# Patient Record
Sex: Female | Born: 1970 | Race: White | Hispanic: No | Smoking: Never smoker
Health system: Southern US, Community
[De-identification: ages and names within clinical notes are randomized; demographics above are authoritative.]

## PROBLEM LIST (undated history)

## (undated) DIAGNOSIS — E039 Hypothyroidism, unspecified: Secondary | ICD-10-CM

## (undated) DIAGNOSIS — R112 Nausea with vomiting, unspecified: Secondary | ICD-10-CM

## (undated) DIAGNOSIS — L309 Dermatitis, unspecified: Secondary | ICD-10-CM

## (undated) DIAGNOSIS — Z9889 Other specified postprocedural states: Secondary | ICD-10-CM

## (undated) DIAGNOSIS — G4730 Sleep apnea, unspecified: Secondary | ICD-10-CM

## (undated) DIAGNOSIS — E0500 Thyrotoxicosis with diffuse goiter without thyrotoxic crisis or storm: Secondary | ICD-10-CM

## (undated) DIAGNOSIS — K219 Gastro-esophageal reflux disease without esophagitis: Secondary | ICD-10-CM

## (undated) DIAGNOSIS — L409 Psoriasis, unspecified: Secondary | ICD-10-CM

## (undated) DIAGNOSIS — K2270 Barrett's esophagus without dysplasia: Secondary | ICD-10-CM

## (undated) HISTORY — PX: OTHER SURGICAL HISTORY: SHX169

## (undated) HISTORY — PX: ESOPHAGOGASTRODUODENOSCOPY: SHX1529

## (undated) HISTORY — PX: COLONOSCOPY: SHX174

## (undated) HISTORY — PX: APPENDECTOMY: SHX54

## (undated) SURGERY — Surgical Case
Anesthesia: *Unknown

---

## 1998-01-24 ENCOUNTER — Ambulatory Visit (HOSPITAL_COMMUNITY): Admission: RE | Admit: 1998-01-24 | Discharge: 1998-01-24 | Payer: Self-pay | Admitting: Obstetrics and Gynecology

## 2001-05-08 ENCOUNTER — Other Ambulatory Visit: Admission: RE | Admit: 2001-05-08 | Discharge: 2001-05-08 | Payer: Self-pay | Admitting: Obstetrics and Gynecology

## 2002-08-05 ENCOUNTER — Other Ambulatory Visit: Admission: RE | Admit: 2002-08-05 | Discharge: 2002-08-05 | Payer: Self-pay | Admitting: Obstetrics and Gynecology

## 2002-11-26 ENCOUNTER — Ambulatory Visit (HOSPITAL_COMMUNITY): Admission: RE | Admit: 2002-11-26 | Discharge: 2002-11-26 | Payer: Self-pay | Admitting: Obstetrics and Gynecology

## 2002-11-26 ENCOUNTER — Encounter: Payer: Self-pay | Admitting: Obstetrics and Gynecology

## 2003-08-19 ENCOUNTER — Other Ambulatory Visit: Admission: RE | Admit: 2003-08-19 | Discharge: 2003-08-19 | Payer: Self-pay | Admitting: Obstetrics and Gynecology

## 2004-08-24 ENCOUNTER — Other Ambulatory Visit: Admission: RE | Admit: 2004-08-24 | Discharge: 2004-08-24 | Payer: Self-pay | Admitting: Obstetrics and Gynecology

## 2006-02-13 ENCOUNTER — Other Ambulatory Visit: Payer: Self-pay

## 2006-02-13 ENCOUNTER — Emergency Department: Payer: Self-pay | Admitting: Emergency Medicine

## 2008-09-25 ENCOUNTER — Emergency Department: Payer: Self-pay | Admitting: Emergency Medicine

## 2009-09-27 ENCOUNTER — Ambulatory Visit: Payer: Self-pay | Admitting: Family Medicine

## 2010-03-13 ENCOUNTER — Ambulatory Visit: Payer: Self-pay | Admitting: Physician Assistant

## 2011-03-14 ENCOUNTER — Ambulatory Visit: Payer: Self-pay | Admitting: Family Medicine

## 2011-03-21 ENCOUNTER — Ambulatory Visit: Payer: Self-pay | Admitting: Family Medicine

## 2011-12-20 ENCOUNTER — Encounter (HOSPITAL_COMMUNITY): Payer: Self-pay | Admitting: *Deleted

## 2011-12-24 ENCOUNTER — Encounter (HOSPITAL_COMMUNITY): Payer: Self-pay | Admitting: Pharmacist

## 2012-01-04 ENCOUNTER — Ambulatory Visit (HOSPITAL_COMMUNITY)
Admission: RE | Admit: 2012-01-04 | Discharge: 2012-01-04 | Disposition: A | Discharge status: Home / Self Care | Payer: 59 | Source: Ambulatory Visit | Attending: Obstetrics and Gynecology | Admitting: Obstetrics and Gynecology

## 2012-01-04 ENCOUNTER — Ambulatory Visit (HOSPITAL_COMMUNITY): Payer: 59 | Admitting: Anesthesiology

## 2012-01-04 ENCOUNTER — Encounter (HOSPITAL_COMMUNITY): Payer: Self-pay | Admitting: Anesthesiology

## 2012-01-04 ENCOUNTER — Encounter (HOSPITAL_COMMUNITY)
Admission: RE | Disposition: A | Discharge status: Home / Self Care | Payer: Self-pay | Source: Ambulatory Visit | Attending: Obstetrics and Gynecology

## 2012-01-04 ENCOUNTER — Encounter (HOSPITAL_COMMUNITY): Payer: Self-pay | Admitting: *Deleted

## 2012-01-04 DIAGNOSIS — N882 Stricture and stenosis of cervix uteri: Secondary | ICD-10-CM | POA: Insufficient documentation

## 2012-01-04 DIAGNOSIS — N939 Abnormal uterine and vaginal bleeding, unspecified: Secondary | ICD-10-CM | POA: Diagnosis present

## 2012-01-04 DIAGNOSIS — N949 Unspecified condition associated with female genital organs and menstrual cycle: Secondary | ICD-10-CM | POA: Insufficient documentation

## 2012-01-04 DIAGNOSIS — N938 Other specified abnormal uterine and vaginal bleeding: Secondary | ICD-10-CM | POA: Insufficient documentation

## 2012-01-04 HISTORY — DX: Thyrotoxicosis with diffuse goiter without thyrotoxic crisis or storm: E05.00

## 2012-01-04 HISTORY — DX: Other specified postprocedural states: Z98.890

## 2012-01-04 HISTORY — DX: Nausea with vomiting, unspecified: R11.2

## 2012-01-04 LAB — BASIC METABOLIC PANEL
CO2: 28 mEq/L (ref 19–32)
Calcium: 9.2 mg/dL (ref 8.4–10.5)
Chloride: 104 mEq/L (ref 96–112)
GFR calc Af Amer: >90 mL/min (ref >90)
Sodium: 138 mEq/L (ref 135–145)

## 2012-01-04 LAB — CBC
MCH: 29.8 pg (ref 26.0–34.0)
Platelets: 226 10*3/uL (ref 150–400)
RBC: 4.56 MIL/uL (ref 3.87–5.11)
WBC: 6.1 10*3/uL (ref 4.0–10.5)

## 2012-01-04 LAB — PREGNANCY, URINE: Preg Test, Ur: NEGATIVE

## 2012-01-04 SURGERY — DILATATION & CURETTAGE/HYSTEROSCOPY WITH RESECTOCOPE
Anesthesia: General | Site: Uterus | Wound class: Clean Contaminated

## 2012-01-04 MED ORDER — MIDAZOLAM HCL 5 MG/5ML IJ SOLN
INTRAMUSCULAR | Status: DC | PRN
  Administered 2012-01-04: 2 mg via INTRAVENOUS

## 2012-01-04 MED ORDER — ONDANSETRON HCL 4 MG/2ML IJ SOLN
INTRAMUSCULAR | Status: AC
  Filled 2012-01-04: qty 2

## 2012-01-04 MED ORDER — LIDOCAINE HCL (CARDIAC) 20 MG/ML IV SOLN
INTRAVENOUS | Status: DC | PRN
  Administered 2012-01-04: 50 mg via INTRAVENOUS

## 2012-01-04 MED ORDER — DEXAMETHASONE SODIUM PHOSPHATE 10 MG/ML IJ SOLN
INTRAMUSCULAR | Status: AC
  Filled 2012-01-04: qty 1

## 2012-01-04 MED ORDER — PROMETHAZINE HCL 25 MG/ML IJ SOLN: 6.2500 mg | INTRAMUSCULAR | Status: DC | PRN

## 2012-01-04 MED ORDER — ONDANSETRON HCL 4 MG/2ML IJ SOLN
INTRAMUSCULAR | Status: DC | PRN
  Administered 2012-01-04: 4 mg via INTRAVENOUS

## 2012-01-04 MED ORDER — MIDAZOLAM HCL 2 MG/2ML IJ SOLN: 0.5000 mg | Freq: Once | INTRAMUSCULAR | Status: DC | PRN

## 2012-01-04 MED ORDER — MEPERIDINE HCL 25 MG/ML IJ SOLN: 6.2500 mg | INTRAMUSCULAR | Status: DC | PRN

## 2012-01-04 MED ORDER — FENTANYL CITRATE 0.05 MG/ML IJ SOLN
INTRAMUSCULAR | Status: DC | PRN
  Administered 2012-01-04: 100 ug via INTRAVENOUS

## 2012-01-04 MED ORDER — PROPOFOL 10 MG/ML IV EMUL
INTRAVENOUS | Status: AC
  Filled 2012-01-04: qty 20

## 2012-01-04 MED ORDER — LIDOCAINE HCL 2 % IJ SOLN
INTRAMUSCULAR | Status: AC
  Filled 2012-01-04: qty 1

## 2012-01-04 MED ORDER — LIDOCAINE HCL (CARDIAC) 20 MG/ML IV SOLN
INTRAVENOUS | Status: AC
  Filled 2012-01-04: qty 5

## 2012-01-04 MED ORDER — KETOROLAC TROMETHAMINE 30 MG/ML IJ SOLN
INTRAMUSCULAR | Status: AC
  Filled 2012-01-04: qty 1

## 2012-01-04 MED ORDER — LIDOCAINE HCL 2 % IJ SOLN
INTRAMUSCULAR | Status: DC | PRN
  Administered 2012-01-04: 16 mL

## 2012-01-04 MED ORDER — LACTATED RINGERS IV SOLN
INTRAVENOUS | Status: DC
  Administered 2012-01-04: 07:00:00 via INTRAVENOUS

## 2012-01-04 MED ORDER — MIDAZOLAM HCL 2 MG/2ML IJ SOLN
INTRAMUSCULAR | Status: AC
  Filled 2012-01-04: qty 2

## 2012-01-04 MED ORDER — KETOROLAC TROMETHAMINE 30 MG/ML IJ SOLN
INTRAMUSCULAR | Status: DC | PRN
  Administered 2012-01-04: 30 mg via INTRAVENOUS

## 2012-01-04 MED ORDER — ACETAMINOPHEN 325 MG PO TABS: 325.0000 mg | ORAL_TABLET | ORAL | Status: DC | PRN

## 2012-01-04 MED ORDER — KETOROLAC TROMETHAMINE 30 MG/ML IJ SOLN: 15.0000 mg | Freq: Once | INTRAMUSCULAR | Status: DC | PRN

## 2012-01-04 MED ORDER — GLYCINE 1.5 % IR SOLN
Status: DC | PRN
  Administered 2012-01-04: 1

## 2012-01-04 MED ORDER — DEXAMETHASONE SODIUM PHOSPHATE 10 MG/ML IJ SOLN
INTRAMUSCULAR | Status: DC | PRN
  Administered 2012-01-04: 10 mg via INTRAVENOUS

## 2012-01-04 MED ORDER — FENTANYL CITRATE 0.05 MG/ML IJ SOLN
INTRAMUSCULAR | Status: AC
  Filled 2012-01-04: qty 2

## 2012-01-04 MED ORDER — LACTATED RINGERS IV SOLN
INTRAVENOUS | Status: DC
  Administered 2012-01-04: 09:00:00 via INTRAVENOUS
  Administered 2012-01-04: 50 mL/h via INTRAVENOUS

## 2012-01-04 MED ORDER — FENTANYL CITRATE 0.05 MG/ML IJ SOLN: 25.0000 ug | INTRAMUSCULAR | Status: DC | PRN

## 2012-01-04 MED ORDER — PROPOFOL 10 MG/ML IV EMUL
INTRAVENOUS | Status: DC | PRN
  Administered 2012-01-04: 20 mg via INTRAVENOUS
  Administered 2012-01-04: 180 mg via INTRAVENOUS

## 2012-01-04 SURGICAL SUPPLY — 11 items
CANISTER SUCTION 2500CC (MISCELLANEOUS) ×2 IMPLANT
CATH ROBINSON RED A/P 16FR (CATHETERS) ×2 IMPLANT
CLOTH BEACON ORANGE TIMEOUT ST (SAFETY) ×2 IMPLANT
CONTAINER PREFILL 10% NBF 60ML (FORM) ×3 IMPLANT
GLOVE BIO SURGEON STRL SZ8 (GLOVE) ×2 IMPLANT
GLOVE ORTHO TXT STRL SZ7.5 (GLOVE) ×2 IMPLANT
GOWN PREVENTION PLUS LG XLONG (DISPOSABLE) ×2 IMPLANT
GOWN STRL REIN XL XLG (GOWN DISPOSABLE) ×2 IMPLANT
PACK HYSTEROSCOPY LF (CUSTOM PROCEDURE TRAY) ×2 IMPLANT
TOWEL OR 17X24 6PK STRL BLUE (TOWEL DISPOSABLE) ×4 IMPLANT
WATER STERILE IRR 1000ML POUR (IV SOLUTION) ×2 IMPLANT

## 2012-01-04 NOTE — H&P (Signed)
Elizabeth Garner is an 41 y.o. female. She presented to the office a little over a month ago with 2-3 months of spotting.  She has a Paragard IUD without problems until now.  Pelvic ultrasound on June 6 was normal with 2 tiny intramural myomas and IUD in proper position.  I was unable to get an emdometrial sample in the office due to a stenotic cervix.  Pertinent Gynecological History: Last pap: normal Date: 03-06-11 OB History: G3, P3003, SVD at term x 2, c-section at term x 1   Menstrual History:  Patient's last menstrual period was 01/02/2012.    Past Medical History  Diagnosis Date  . PONV (postoperative nausea and vomiting)   . Graves disease     In "remission"  OCD Interstitial cystitis  Past Surgical History  Procedure Date  . Appendectomy   . Cesarean section     History reviewed. No pertinent family history.  Social History:  reports that she has never smoked. She does not have any smokeless tobacco history on file. She reports that she drinks about 1.2 ounces of alcohol per week. She reports that she does not use illicit drugs.  Allergies:  Allergies  Allergen Reactions  . Codeine Nausea And Vomiting    Family History: Mother with endometrial cancer  Prescriptions prior to admission  Medication Sig Dispense Refill  . ibuprofen (ADVIL,MOTRIN) 200 MG tablet Take 400-800 mg by mouth every 6 (six) hours as needed. For headache and pain      . tetrahydrozoline 0.05 % ophthalmic solution Place 1 drop into both eyes as needed. Visine for eye irritation        Review of Systems  Respiratory: Negative.   Cardiovascular: Negative.   Gastrointestinal: Negative.     Blood pressure 105/78, pulse 70, temperature 98.4 F (36.9 C), temperature source Oral, resp. rate 16, height 5' (1.524 m), weight 75.892 kg (167 lb 5 oz), last menstrual period 01/02/2012, SpO2 100.00%. Physical Exam  Constitutional: She appears well-developed and well-nourished.  Neck: Neck supple. No  thyromegaly present.  Cardiovascular: Normal rate, regular rhythm and normal heart sounds.   No murmur heard. Respiratory: Effort normal and breath sounds normal. No respiratory distress. She has no wheezes.  GI: Soft. She exhibits no distension. There is no tenderness.  Genitourinary: Vagina normal.       Uterus anteverted, upper limits of normal size No adnexal mass IUD strings at 2-3 cm    Results for orders placed during the hospital encounter of 01/04/12 (from the past 24 hour(s))  PREGNANCY, URINE     Status: Normal   Collection Time   01/04/12  6:00 AM      Component Value Range   Preg Test, Ur NEGATIVE  NEGATIVE  CBC     Status: Normal   Collection Time   01/04/12  6:30 AM      Component Value Range   WBC 6.1  4.0 - 10.5 K/uL   RBC 4.56  3.87 - 5.11 MIL/uL   Hemoglobin 13.6  12.0 - 15.0 g/dL   HCT 16.1  09.6 - 04.5 %   MCV 90.1  78.0 - 100.0 fL   MCH 29.8  26.0 - 34.0 pg   MCHC 33.1  30.0 - 36.0 g/dL   RDW 40.9  81.1 - 91.4 %   Platelets 226  150 - 400 K/uL    No results found.  Assessment/Plan: AUB with a stenotic cervix and Paragard IUD.  Will take to OR for hysteroscopy,  D&C for endometrial sampling, try to keep IUD in place.  Taqwa Deem D 01/04/2012, 7:04 AM

## 2012-01-04 NOTE — Interval H&P Note (Signed)
History and Physical Interval Note:  01/04/2012 7:17 AM  Elizabeth Garner  has presented today for surgery, with the diagnosis of Abnormal Uterine Bleeding  The various methods of treatment have been discussed with the patient and family. After consideration of risks, benefits and other options for treatment, the patient has consented to  Procedure(s) (LRB): DILATATION & CURETTAGE/HYSTEROSCOPY  as a surgical intervention .  The patient's history has been reviewed, patient examined, no change in status, stable for surgery.  I have reviewed the patients' chart and labs.  Questions were answered to the patient's satisfaction.     Keshanna Riso D

## 2012-01-04 NOTE — Transfer of Care (Signed)
Immediate Anesthesia Transfer of Care Note  Patient: Elizabeth Garner  Procedure(s) Performed: Procedure(s) (LRB): DILATATION & CURETTAGE/HYSTEROSCOPY WITH RESECTOCOPE (N/A)  Patient Location: PACU  Anesthesia Type: General  Level of Consciousness: sedated  Airway & Oxygen Therapy: Patient Spontanous Breathing and Patient connected to nasal cannula oxygen  Post-op Assessment: Report given to PACU RN  Post vital signs: Reviewed and stable  Complications: No apparent anesthesia complications

## 2012-01-04 NOTE — Anesthesia Preprocedure Evaluation (Signed)
Anesthesia Evaluation  Patient identified by MRN, date of birth, ID band Patient awake    Reviewed: Allergy & Precautions, H&P , Patient's Chart, lab work & pertinent test results, reviewed documented beta blocker date and time   History of Anesthesia Complications (+) PONV  Airway Mallampati: II TM Distance: >3 FB Neck ROM: full    Dental No notable dental hx.    Pulmonary neg pulmonary ROS,  breath sounds clear to auscultation  Pulmonary exam normal       Cardiovascular Exercise Tolerance: Good negative cardio ROS  Rhythm:regular Rate:Normal     Neuro/Psych negative neurological ROS  negative psych ROS   GI/Hepatic negative GI ROS, Neg liver ROS,   Endo/Other  negative endocrine ROSHyperthyroidism   Renal/GU negative Renal ROS     Musculoskeletal   Abdominal   Peds  Hematology negative hematology ROS (+)   Anesthesia Other Findings  PONV (postoperative nausea and vomiting)     Graves disease   In "remission"   Reproductive/Obstetrics negative OB ROS                           Anesthesia Physical Anesthesia Plan  ASA: II  Anesthesia Plan: General LMA   Post-op Pain Management:    Induction:   Airway Management Planned:   Additional Equipment:   Intra-op Plan:   Post-operative Plan:   Informed Consent: I have reviewed the patients History and Physical, chart, labs and discussed the procedure including the risks, benefits and alternatives for the proposed anesthesia with the patient or authorized representative who has indicated his/her understanding and acceptance.   Dental Advisory Given  Plan Discussed with: CRNA, Surgeon and Anesthesiologist  Anesthesia Plan Comments:         Anesthesia Quick Evaluation

## 2012-01-04 NOTE — Anesthesia Postprocedure Evaluation (Signed)
  Anesthesia Post-op Note  Patient: Elizabeth Garner  Procedure(s) Performed: Procedure(s) (LRB): DILATATION & CURETTAGE/HYSTEROSCOPY WITH RESECTOCOPE (N/A)  Patient is awake and responsive. Pain and nausea are reasonably well controlled. Vital signs are stable and clinically acceptable. Oxygen saturation is clinically acceptable. There are no apparent anesthetic complications at this time. Patient is ready for discharge.

## 2012-01-04 NOTE — Op Note (Signed)
Preoperative diagnosis: Abnormal uterine bleeding Postoperative diagnosis: Abnormal uterine bleeding Procedure: Hysteroscopy with endocervical curettage Surgeon: Lavina Hamman M.D. Anesthesia: Gen. With an LMA, paracervical block Findings: She had a normal endometrial cavity with an atrophic endometrium. No significant endometrial lesions, no fibroids or polyps.  Paragard IUD in proper position Estimated blood loss: Minimal Fluid deficit: Through the hysteroscope fluid deficit was minimal Specimens: Endocervical curettings sent for routine pathology Complications: None  Procedure in detail: The patient was taken to the operating room and placed in the dorsosupine position. General anesthesia was induced. She was placed in mobile stirrups and legs were elevated. Perineum and vagina were prepped and draped in the usual sterile fashion and bladder drained with a red Robinson catheter. A Graves speculum was inserted in the vagina and the anterior lip of the cervix was grasped with a single-tooth tenaculum. Paracervical block was performed with a total of 16 cc of 2% plain lidocaine. The uterus then sounded to 8-9 cm. IUD strings were at 2 cm.  The cervix was gradually dilated to a size 19 dilator without difficulty. The observer hysteroscope was inserted and good visualization was achieved. No significant endometrial lesions were identified, in fact the endometrium was atrophic.  The IUD was in proper position. There was a small amount of irregular endocervical tissue.  The hysteroscope was removed.  Endocervical curettage was performed.  . The single-tooth tenaculum was removed from the cervix and bleeding was controlled with pressure. All instruments were then removed from the vagina. The patient tolerated the procedure well and was taken to the recovery in stable condition. Counts were correct and she had PAS hose on throughout the procedure.

## 2013-03-31 ENCOUNTER — Ambulatory Visit: Payer: Self-pay

## 2013-04-07 ENCOUNTER — Other Ambulatory Visit: Payer: Self-pay

## 2013-04-07 LAB — HCG, QUANTITATIVE, PREGNANCY: Beta Hcg, Quant.: <1 m[IU]/mL — ABNORMAL LOW

## 2013-04-08 ENCOUNTER — Ambulatory Visit: Payer: Self-pay

## 2013-10-28 ENCOUNTER — Ambulatory Visit: Payer: Self-pay | Admitting: Physician Assistant

## 2014-01-08 ENCOUNTER — Ambulatory Visit: Payer: Self-pay | Admitting: Neurology

## 2014-02-05 ENCOUNTER — Ambulatory Visit: Payer: Self-pay | Admitting: Neurology

## 2014-03-18 ENCOUNTER — Ambulatory Visit: Payer: Self-pay | Admitting: Unknown Physician Specialty

## 2014-04-30 ENCOUNTER — Ambulatory Visit: Payer: Self-pay | Admitting: Gastroenterology

## 2014-10-18 LAB — SURGICAL PATHOLOGY

## 2015-05-05 ENCOUNTER — Encounter: Payer: Self-pay | Admitting: *Deleted

## 2015-05-06 ENCOUNTER — Ambulatory Visit
Admission: RE | Admit: 2015-05-06 | Discharge: 2015-05-06 | Disposition: A | Discharge status: Home / Self Care | Payer: 59 | Source: Ambulatory Visit | Attending: Gastroenterology | Admitting: Gastroenterology

## 2015-05-06 ENCOUNTER — Ambulatory Visit: Payer: 59 | Admitting: Anesthesiology

## 2015-05-06 ENCOUNTER — Encounter: Payer: Self-pay | Admitting: *Deleted

## 2015-05-06 ENCOUNTER — Encounter
Admission: RE | Disposition: A | Discharge status: Home / Self Care | Payer: Self-pay | Source: Ambulatory Visit | Attending: Gastroenterology

## 2015-05-06 DIAGNOSIS — G4730 Sleep apnea, unspecified: Secondary | ICD-10-CM | POA: Diagnosis not present

## 2015-05-06 DIAGNOSIS — K317 Polyp of stomach and duodenum: Secondary | ICD-10-CM | POA: Insufficient documentation

## 2015-05-06 DIAGNOSIS — E669 Obesity, unspecified: Secondary | ICD-10-CM | POA: Diagnosis not present

## 2015-05-06 DIAGNOSIS — K210 Gastro-esophageal reflux disease with esophagitis: Secondary | ICD-10-CM | POA: Insufficient documentation

## 2015-05-06 DIAGNOSIS — Z885 Allergy status to narcotic agent status: Secondary | ICD-10-CM | POA: Insufficient documentation

## 2015-05-06 DIAGNOSIS — Z79899 Other long term (current) drug therapy: Secondary | ICD-10-CM | POA: Diagnosis not present

## 2015-05-06 DIAGNOSIS — Z6834 Body mass index (BMI) 34.0-34.9, adult: Secondary | ICD-10-CM | POA: Insufficient documentation

## 2015-05-06 DIAGNOSIS — K2270 Barrett's esophagus without dysplasia: Secondary | ICD-10-CM | POA: Diagnosis present

## 2015-05-06 HISTORY — DX: Barrett's esophagus without dysplasia: K22.70

## 2015-05-06 HISTORY — PX: ESOPHAGOGASTRODUODENOSCOPY (EGD) WITH PROPOFOL: SHX5813

## 2015-05-06 HISTORY — DX: Gastro-esophageal reflux disease without esophagitis: K21.9

## 2015-05-06 HISTORY — DX: Sleep apnea, unspecified: G47.30

## 2015-05-06 SURGERY — ESOPHAGOGASTRODUODENOSCOPY (EGD) WITH PROPOFOL
Anesthesia: General

## 2015-05-06 MED ORDER — PROPOFOL 10 MG/ML IV BOLUS
INTRAVENOUS | Status: DC | PRN
  Administered 2015-05-06: 100 mg via INTRAVENOUS

## 2015-05-06 MED ORDER — SODIUM CHLORIDE 0.9 % IV SOLN: INTRAVENOUS | Status: DC

## 2015-05-06 MED ORDER — PROPOFOL 500 MG/50ML IV EMUL
INTRAVENOUS | Status: DC | PRN
  Administered 2015-05-06: 120 ug/kg/min via INTRAVENOUS

## 2015-05-06 MED ORDER — SODIUM CHLORIDE 0.9 % IV SOLN
INTRAVENOUS | Status: DC
  Administered 2015-05-06 (×2): via INTRAVENOUS

## 2015-05-06 NOTE — H&P (Signed)
Outpatient short stay form Pre-procedure 05/06/2015 12:00 PM Elizabeth DeemMartin U Skulskie MD  Primary Physician: Dr. Burnell BlanksMaura Hamrick  Reason for visit:  EGD  History of present illness:  Personal history of Barrett's esophagus. Patient is a 44 year old female presenting today for surveillance. He takes no aspirin products or blood thinners. He has been taking pantoprazole 40 mg stay. Overall her symptoms are from previous. He has no dysphagia.    Current facility-administered medications:  .  0.9 %  sodium chloride infusion, , Intravenous, Continuous, Elizabeth DeemMartin U Skulskie, MD, Last Rate: 20 mL/hr at 05/06/15 1139 .  0.9 %  sodium chloride infusion, , Intravenous, Continuous, Elizabeth DeemMartin U Skulskie, MD  Prescriptions prior to admission  Medication Sig Dispense Refill Last Dose  . acetaminophen (TYLENOL) 500 MG tablet Take 500 mg by mouth every 6 (six) hours as needed.     . Flaxseed, Linseed, (FLAX SEED OIL PO) Take by mouth.     . levothyroxine (SYNTHROID, LEVOTHROID) 25 MCG tablet Take 25 mcg by mouth daily before breakfast.   05/06/2015 at 0400  . Multiple Vitamins-Minerals (MULTIVITAMIN & MINERAL PO) Take by mouth.     . pantoprazole (PROTONIX) 40 MG tablet Take 40 mg by mouth daily.     . fluocinonide gel (LIDEX) 0.05 % Apply 1 application topically 2 (two) times daily.   Not Taking at Unknown time  . ibuprofen (ADVIL,MOTRIN) 200 MG tablet Take 400-800 mg by mouth every 6 (six) hours as needed. For headache and pain   01/03/2012 at Unknown  . tetrahydrozoline 0.05 % ophthalmic solution Place 1 drop into both eyes as needed. Visine for eye irritation   Not Taking at Unknown time     Allergies  Allergen Reactions  . Codeine Nausea And Vomiting     Past Medical History  Diagnosis Date  . PONV (postoperative nausea and vomiting)   . Barrett's esophagus   . GERD (gastroesophageal reflux disease)   . Graves disease     In "remission"  . Sleep apnea     Review of systems:      Physical  Exam    Heart and lungs: Regular rate and rhythm without rub or gallop, lungs are bilaterally clear    HEENT: Normocephalic atraumatic eyes are anicteric    Other:     Pertinant exam for procedure: Soft nontender nondistended bowel sounds positive normoactive    Planned proceedures: EGD and indicated procedures. I have discussed the risks benefits and complications of procedures to include not limited to bleeding, infection, perforation and the risk of sedation and the patient wishes to proceed.    Elizabeth DeemMartin U Skulskie, MD Gastroenterology 05/06/2015  12:00 PM

## 2015-05-06 NOTE — Transfer of Care (Signed)
Immediate Anesthesia Transfer of Care Note  Patient: Elizabeth Garner  Procedure(s) Performed: Procedure(s): ESOPHAGOGASTRODUODENOSCOPY (EGD) WITH PROPOFOL (N/A)  Patient Location: PACU  Anesthesia Type:General  Level of Consciousness: awake, alert  and oriented  Airway & Oxygen Therapy: Patient Spontanous Breathing and Patient connected to nasal cannula oxygen  Post-op Assessment: Report given to RN and Post -op Vital signs reviewed and stable  Post vital signs: stable  Last Vitals:  Filed Vitals:   05/06/15 1119  BP: 93/61  Pulse: 72  Temp: 37.1 C  Resp: 19    Complications: No apparent anesthesia complications

## 2015-05-06 NOTE — Op Note (Signed)
[facility] Gastroenterology Patient Name: Elizabeth Garner Procedure Date: 05/06/2015 11:52 AM MRN: [id number] Account #: [id number] Date of Birth: [birth year] Admit Type: Outpatient Age: 44 Room: [facility] ENDO ROOM 3 Gender: Female Note Status: Finalized Procedure:         Upper GI endoscopy Indications:       Follow-up of Barrett's esophagus Providers:         Christena Deem, MD Referring MD:      Carson Myrtle, MD (Referring MD) Medicines:         Monitored Anesthesia Care Complications:     No immediate complications. Procedure:         Pre-Anesthesia Assessment:                    - ASA Grade Assessment: II - A patient with mild systemic                     disease.                    After obtaining informed consent, the endoscope was passed                     under direct vision. Throughout the procedure, the                     patient's blood pressure, pulse, and oxygen saturations                     were monitored continuously. The Endoscope was introduced                     through the mouth, and advanced to the second part of                     duodenum. The upper GI endoscopy was accomplished without                     difficulty. The patient tolerated the procedure well. Findings:      There were esophageal mucosal changes consistent with short-segment       Barrett's esophagus present in the lower third of the esophagus. The       maximum longitudinal extent of these mucosal changes was 2 cm in length.       Mucosa was biopsied with a cold forceps for histology in 4 quadrants at       the gastroesophageal junction.      The exam of the esophagus was otherwise normal.      A few 2 to 3 mm sessile polyps with no bleeding and no stigmata of       recent bleeding were found in the gastric body. Biopsies were taken with       a cold forceps for histology.      The exam of the stomach was otherwise normal.      The examined duodenum was normal. I was  unable to go beyond the second       portion of the c-loop due to sharp angulation. Impression:        - Esophageal mucosal changes consistent with short-segment                     Barrett's esophagus. Biopsied.                    -  A few gastric polyps. Biopsied.                    - Normal examined duodenum. Recommendation:    - Discharge patient to home.                    - Continue present medications.                    - Await pathology results.                    - Telephone GI clinic for pathology results in 1 week. Procedure Code(s): --- Professional ---                    [id number], Esophagogastroduodenoscopy, flexible, transoral;                     with biopsy, single or multiple Diagnosis Code(s): --- Professional ---                    K22.70, Barrett's esophagus without dysplasia                    K31.7, Polyp of stomach and duodenum CPT copyright 2014 [employer]. All rights reserved. The codes documented in this report are preliminary and upon coder review may  be revised to meet current compliance requirements. Christena DeemMartin U Arrin Ishler, MD 05/06/2015 12:22:52 PM This report has been signed electronically. Number of Addenda: 0 Note Initiated On: 05/06/2015 11:52 AM      [facility]

## 2015-05-06 NOTE — Anesthesia Preprocedure Evaluation (Signed)
Anesthesia Evaluation  Patient identified by MRN, date of birth, ID band Patient awake    Reviewed: Allergy & Precautions, NPO status , Patient's Chart, lab work & pertinent test results  History of Anesthesia Complications (+) PONV  Airway Mallampati: II       Dental  (+) Teeth Intact   Pulmonary sleep apnea ,    Pulmonary exam normal        Cardiovascular negative cardio ROS   Rhythm:Regular Rate:Normal     Neuro/Psych    GI/Hepatic Neg liver ROS, GERD  ,  Endo/Other  Hyperthyroidism   Renal/GU negative Renal ROS     Musculoskeletal   Abdominal (+) + obese,   Peds  Hematology   Anesthesia Other Findings   Reproductive/Obstetrics                             Anesthesia Physical Anesthesia Plan  ASA: II  Anesthesia Plan: General   Post-op Pain Management:    Induction: Intravenous  Airway Management Planned: Nasal Cannula  Additional Equipment:   Intra-op Plan:   Post-operative Plan:   Informed Consent: I have reviewed the patients History and Physical, chart, labs and discussed the procedure including the risks, benefits and alternatives for the proposed anesthesia with the patient or authorized representative who has indicated his/her understanding and acceptance.     Plan Discussed with: CRNA  Anesthesia Plan Comments:         Anesthesia Quick Evaluation

## 2015-05-09 LAB — SURGICAL PATHOLOGY

## 2015-05-10 NOTE — Anesthesia Postprocedure Evaluation (Signed)
  Anesthesia Post-op Note  Patient: Elizabeth Garner  Procedure(s) Performed: Procedure(s): ESOPHAGOGASTRODUODENOSCOPY (EGD) WITH PROPOFOL (N/A)  Anesthesia type:General  Patient location: PACU  Post pain: Pain level controlled  Post assessment: Post-op Vital signs reviewed, Patient's Cardiovascular Status Stable, Respiratory Function Stable, Patent Airway and No signs of Nausea or vomiting  Post vital signs: Reviewed and stable  Last Vitals:  Filed Vitals:   05/06/15 1250  BP: 100/70  Pulse: 93  Temp:   Resp: 16    Level of consciousness: awake, alert  and patient cooperative  Complications: No apparent anesthesia complications

## 2015-05-11 ENCOUNTER — Encounter: Payer: Self-pay | Admitting: Gastroenterology

## 2015-05-27 ENCOUNTER — Other Ambulatory Visit: Payer: Self-pay | Admitting: Orthopaedic Surgery

## 2015-05-27 DIAGNOSIS — M25552 Pain in left hip: Secondary | ICD-10-CM

## 2015-06-14 ENCOUNTER — Ambulatory Visit
Admission: RE | Admit: 2015-06-14 | Discharge: 2015-06-14 | Disposition: A | Discharge status: Home / Self Care | Payer: 59 | Source: Ambulatory Visit | Attending: Orthopaedic Surgery | Admitting: Orthopaedic Surgery

## 2015-06-14 DIAGNOSIS — M25552 Pain in left hip: Secondary | ICD-10-CM

## 2015-06-14 MED ORDER — IOHEXOL 180 MG/ML  SOLN
15.0000 mL | Freq: Once | INTRAMUSCULAR | Status: AC | PRN
  Administered 2015-06-14: 15 mL via INTRA_ARTICULAR

## 2015-10-04 ENCOUNTER — Other Ambulatory Visit: Payer: Self-pay | Admitting: Gastroenterology

## 2015-10-04 DIAGNOSIS — R1310 Dysphagia, unspecified: Secondary | ICD-10-CM

## 2015-11-07 ENCOUNTER — Ambulatory Visit: Payer: 59

## 2015-11-08 ENCOUNTER — Ambulatory Visit
Admission: RE | Admit: 2015-11-08 | Discharge: 2015-11-08 | Disposition: A | Discharge status: Home / Self Care | Payer: 59 | Source: Ambulatory Visit | Attending: Gastroenterology | Admitting: Gastroenterology

## 2015-11-08 DIAGNOSIS — R1310 Dysphagia, unspecified: Secondary | ICD-10-CM

## 2015-12-25 ENCOUNTER — Emergency Department: Payer: 59

## 2015-12-25 ENCOUNTER — Emergency Department
Admission: EM | Admit: 2015-12-25 | Discharge: 2015-12-25 | Disposition: A | Discharge status: Home / Self Care | Payer: 59 | Attending: Emergency Medicine | Admitting: Emergency Medicine

## 2015-12-25 ENCOUNTER — Encounter: Payer: Self-pay | Admitting: *Deleted

## 2015-12-25 DIAGNOSIS — M542 Cervicalgia: Secondary | ICD-10-CM

## 2015-12-25 LAB — CBC WITH DIFFERENTIAL/PLATELET
BASOS ABS: 0.1 10*3/uL (ref 0–0.1)
Basophils Relative: 1 %
Eosinophils Absolute: 0 10*3/uL (ref 0–0.7)
Eosinophils Relative: 1 %
HEMATOCRIT: 38.6 % (ref 35.0–47.0)
HEMOGLOBIN: 13.6 g/dL (ref 12.0–16.0)
LYMPHS PCT: 36 %
Lymphs Abs: 2.2 10*3/uL (ref 1.0–3.6)
MCH: 31.1 pg (ref 26.0–34.0)
MCHC: 35.3 g/dL (ref 32.0–36.0)
MCV: 87.9 fL (ref 80.0–100.0)
MONO ABS: 0.3 10*3/uL (ref 0.2–0.9)
MONOS PCT: 5 %
NEUTROS ABS: 3.6 10*3/uL (ref 1.4–6.5)
NEUTROS PCT: 57 %
Platelets: 239 10*3/uL (ref 150–440)
RBC: 4.39 MIL/uL (ref 3.80–5.20)
RDW: 13.2 % (ref 11.5–14.5)
WBC: 6.3 10*3/uL (ref 3.6–11.0)

## 2015-12-25 LAB — BASIC METABOLIC PANEL
ANION GAP: 7 (ref 5–15)
BUN: 11 mg/dL (ref 6–20)
CALCIUM: 9.1 mg/dL (ref 8.9–10.3)
CHLORIDE: 103 mmol/L (ref 101–111)
CO2: 27 mmol/L (ref 22–32)
CREATININE: 0.73 mg/dL (ref 0.44–1.00)
GFR calc Af Amer: >60 mL/min (ref >60)
GLUCOSE: 91 mg/dL (ref 65–99)
POTASSIUM: 3.6 mmol/L (ref 3.5–5.1)
Sodium: 137 mmol/L (ref 135–145)

## 2015-12-25 LAB — POCT PREGNANCY, URINE: PREG TEST UR: NEGATIVE

## 2015-12-25 MED ORDER — SODIUM CHLORIDE 0.9 % IV BOLUS (SEPSIS)
1000.0000 mL | Freq: Once | INTRAVENOUS | Status: AC
  Administered 2015-12-25: 1000 mL via INTRAVENOUS

## 2015-12-25 MED ORDER — IOPAMIDOL (ISOVUE-300) INJECTION 61%
75.0000 mL | Freq: Once | INTRAVENOUS | Status: AC | PRN
  Administered 2015-12-25: 75 mL via INTRAVENOUS

## 2015-12-25 NOTE — ED Provider Notes (Signed)
[facility] Emergency Department Provider Note   ____________________________________________  Time seen: Approximately 1 PM  I have reviewed the triage vital signs and the nursing notes.   HISTORY  Chief Complaint Lymphadenopathy   HPI Elizabeth SnellenDebbie P Castiglia is a 45 y.o. female with a history of Barrett's esophagus and Graves' disease in remission was presenting to the emergency department today with left-sided neck pain which is been ongoing for the past 3 months. She says that she has also experienced swelling the left side of her neck as well which is mild. She says that there is a dull pain to the left side of the neck especially when she talks and swallows. She has been evaluated by a chiropractor as well as her endocrinologist. She has had a muscle relaxer with only minimal relief. She was sent to urgent care earlier this morning for further evaluation. She is concerned about things like strep throat and also meningitis because of the neck pain. No posterior neck pain.Denies fever.   Past Medical History  Diagnosis Date  . PONV (postoperative nausea and vomiting)   . Barrett's esophagus   . GERD (gastroesophageal reflux disease)   . Graves disease     In "remission"  . Sleep apnea     Patient Active Problem List   Diagnosis Date Noted  . Abnormal uterine bleeding 01/04/2012    Past Surgical History  Procedure Laterality Date  . Cesarean section    . Appendectomy    . Cesarean section    . Repair and excision eyl\elid    . Colonoscopy    . Esophagogastroduodenoscopy    . Esophagogastroduodenoscopy (egd) with propofol N/A 05/06/2015    Procedure: ESOPHAGOGASTRODUODENOSCOPY (EGD) WITH PROPOFOL;  Surgeon: Christena DeemMartin U Skulskie, MD;  [facility];  Service: Endoscopy;  Laterality: N/A;    Current Outpatient Rx  Name  Route  Sig  Dispense  Refill  . acetaminophen (TYLENOL) 500 MG tablet   Oral   Take 500 mg by mouth every 6 (six) hours  as needed.         . Flaxseed, Linseed, (FLAX SEED OIL PO)   Oral   Take by mouth.         . fluocinonide gel (LIDEX) 0.05 %   Topical   Apply 1 application topically 2 (two) times daily.         Marland Kitchen. ibuprofen (ADVIL,MOTRIN) 200 MG tablet   Oral   Take 400-800 mg by mouth every 6 (six) hours as needed. For headache and pain         . levothyroxine (SYNTHROID, LEVOTHROID) 25 MCG tablet   Oral   Take 25 mcg by mouth daily before breakfast.         . Multiple Vitamins-Minerals (MULTIVITAMIN & MINERAL PO)   Oral   Take by mouth.         . pantoprazole (PROTONIX) 40 MG tablet   Oral   Take 40 mg by mouth daily.         Marland Kitchen. tetrahydrozoline 0.05 % ophthalmic solution   Both Eyes   Place 1 drop into both eyes as needed. Visine for eye irritation           Allergies Codeine  No family history on file.  Social History Social History  Substance Use Topics  . Smoking status: Never Smoker   . Smokeless tobacco: None  . Alcohol Use: 1.2 oz/week    2 Glasses of wine per week  Review of Systems Constitutional: No fever/chills Eyes: No visual changes. ENT: No sore throat. Cardiovascular: Denies chest pain. Respiratory: Denies shortness of breath. Gastrointestinal: No abdominal pain.  No nausea, no vomiting.  No diarrhea.  No constipation. Genitourinary: Negative for dysuria. Musculoskeletal: Negative for back pain. Skin: Negative for rash. Neurological: Negative for headaches, focal weakness or numbness.  10-point ROS otherwise negative.  ____________________________________________   PHYSICAL EXAM:  VITAL SIGNS: ED Triage Vitals  Enc Vitals Group     BP 12/25/15 1053 123/77 mmHg     Pulse Rate 12/25/15 1053 84     Resp 12/25/15 1053 20     Temp 12/25/15 1053 98.1 F (36.7 C)     Temp Source 12/25/15 1053 Oral     SpO2 12/25/15 1053 99 %     Weight 12/25/15 1053 183 lb (83.008 kg)     Height 12/25/15 1053  (1.549 m)     Head Cir --       Peak Flow --      Pain Score 12/25/15 1051 3     Pain Loc --      Pain Edu? --      Excl. in GC? --     Constitutional: Alert and oriented. Well appearing and in no acute distress. Eyes: Conjunctivae are normal. PERRL. EOMI. Head: Atraumatic. Nose: No congestion/rhinnorhea. Mouth/Throat: Mucous membranes are moist.  Oropharynx non-erythematous. Neck: No stridor.  Very difficult to notice swelling to the left side of the neck. Very mild tenderness to palpation just lateral to the trachea on the left. No tender lymphadenopathy. No swollen lymph nodes. Able to range her neck freely without any meningismus. Cardiovascular: Normal rate, regular rhythm. Grossly normal heart sounds.   Respiratory: Normal respiratory effort.  No retractions. Lungs CTAB. Gastrointestinal: Soft and nontender. No distention.  Musculoskeletal: No lower extremity tenderness nor edema.  No joint effusions. Neurologic:  Normal speech and language. No gross focal neurologic deficits are appreciated.  Skin:  Skin is warm, dry and intact. No rash noted. Psychiatric: Mood and affect are normal. Speech and behavior are normal.  ____________________________________________   LABS (all labs ordered are listed, but only abnormal results are displayed)  Labs Reviewed  CBC WITH DIFFERENTIAL/PLATELET  BASIC METABOLIC PANEL  POCT PREGNANCY, URINE   ____________________________________________  EKG   ____________________________________________  RADIOLOGY   CT Soft Tissue Neck W Contrast (Final result) Result time: 12/25/15 15:48:25   Final result by Rad Results In Interface (12/25/15 15:48:25)   Narrative:   CLINICAL DATA: 45 year old female with left neck gland swelling and pain for 3 months. History of Graves disease. Nonsmoker. Initial encounter.  EXAM: CT NECK WITH CONTRAST  TECHNIQUE: Multidetector CT imaging of the neck was performed using the standard protocol following the bolus administration  of intravenous contrast.  CONTRAST: 75mL ISOVUE-300 IOPAMIDOL (ISOVUE-300) INJECTION 61%  COMPARISON: No comparison CT of the neck. Brain MR 10/28/2013.  FINDINGS: Pharynx and larynx: Evaluation slightly limited by dental artifact. No worrisome mass or inflammation identified. Palatine tonsil calcifications consistent with result of prior inflammation. Symmetric mild prominence of lingual tonsils.  Salivary glands: No primary mass or inflammation.  Thyroid: Small size without dominant mass.  Lymph nodes: Elongated lymph nodes without adenopathy or necrosis.  Vascular: Unremarkable  Limited intracranial: . Unremarkable.  Visualized orbits: Unremarkable.  Mastoids and visualized paranasal sinuses: Minimal partial opacification left maxillary sinus.  Skeleton: Cervical spondylotic changes most notable on the right at the C6-7 level where disc protrusion causes mild cord  flattening.  Upper chest: Probable subpleural scarring without worrisome mass noted.  IMPRESSION: No worrisome neck mass or inflammation noted as detailed above.  Cervical spondylotic changes with findings most notable on the right at the C6-7 level as detailed above.   Electronically Signed By: Lacy DuverneySteven Olson M.D. On: 12/25/2015 15:48       ____________________________________________   PROCEDURES   ____________________________________________   INITIAL IMPRESSION / ASSESSMENT AND PLAN / ED COURSE  Pertinent labs & imaging results that were available during my care of the patient were reviewed by me and considered in my medical decision making (see chart for details).  We'll get a CAT scan of the patient's neck to evaluate for any masses. She has had multiple other evaluations as an outpatient and I feel that imaging at this point will answer a lot of questions about multiple different pathologies that this could be. I explained to her the plan and she is understanding and willing to  comply.  ----------------------------------------- 4:04 PM on 12/25/2015 -----------------------------------------  Patient resting comfortably at this time. No definitive cause of her pain found on her CAT scan of her neck. We did review the results including the spondylitic changes at the C6-7 level. I recommended follow with her nose and throat she [facility] [place]. He understands plan and willing to comply. Will be discharged home. ____________________________________________   FINAL CLINICAL IMPRESSION(S) / ED DIAGNOSES  Neck pain.    NEW MEDICATIONS STARTED DURING THIS VISIT:  New Prescriptions   No medications on file     Note:  This document was prepared using Dragon voice recognition software and may include unintentional dictation errors.    Myrna Blazeravid Matthew Schaevitz, MD 12/25/15 [phone]

## 2015-12-25 NOTE — ED Notes (Signed)
Pt has had swelling to left neck gland for 3 months, pt has seen multiple physicians and tests for this, pt complains of pain and swelling to left neck

## 2016-09-20 ENCOUNTER — Encounter (HOSPITAL_COMMUNITY): Payer: Self-pay

## 2016-09-20 ENCOUNTER — Emergency Department (HOSPITAL_COMMUNITY)
Admission: EM | Admit: 2016-09-20 | Discharge: 2016-09-20 | Disposition: A | Discharge status: Home / Self Care | Payer: Worker's Compensation | Attending: Emergency Medicine | Admitting: Emergency Medicine

## 2016-09-20 ENCOUNTER — Emergency Department (HOSPITAL_COMMUNITY): Payer: Worker's Compensation

## 2016-09-20 DIAGNOSIS — Y9289 Other specified places as the place of occurrence of the external cause: Secondary | ICD-10-CM | POA: Diagnosis not present

## 2016-09-20 DIAGNOSIS — S62620A Displaced fracture of medial phalanx of right index finger, initial encounter for closed fracture: Secondary | ICD-10-CM | POA: Insufficient documentation

## 2016-09-20 DIAGNOSIS — Y9389 Activity, other specified: Secondary | ICD-10-CM | POA: Diagnosis not present

## 2016-09-20 DIAGNOSIS — S62622A Displaced fracture of medial phalanx of right middle finger, initial encounter for closed fracture: Secondary | ICD-10-CM | POA: Diagnosis not present

## 2016-09-20 DIAGNOSIS — W3189XA Contact with other specified machinery, initial encounter: Secondary | ICD-10-CM | POA: Insufficient documentation

## 2016-09-20 DIAGNOSIS — S6991XA Unspecified injury of right wrist, hand and finger(s), initial encounter: Secondary | ICD-10-CM | POA: Diagnosis present

## 2016-09-20 DIAGNOSIS — Y990 Civilian activity done for income or pay: Secondary | ICD-10-CM | POA: Diagnosis not present

## 2016-09-20 DIAGNOSIS — S61219A Laceration without foreign body of unspecified finger without damage to nail, initial encounter: Secondary | ICD-10-CM

## 2016-09-20 DIAGNOSIS — Z23 Encounter for immunization: Secondary | ICD-10-CM | POA: Insufficient documentation

## 2016-09-20 DIAGNOSIS — S62609A Fracture of unspecified phalanx of unspecified finger, initial encounter for closed fracture: Secondary | ICD-10-CM

## 2016-09-20 MED ORDER — CEPHALEXIN 500 MG PO CAPS: 500.0000 mg | ORAL_CAPSULE | Freq: Two times a day (BID) | ORAL | 0 refills | Status: AC

## 2016-09-20 MED ORDER — TETANUS-DIPHTH-ACELL PERTUSSIS 5-2.5-18.5 LF-MCG/0.5 IM SUSP
0.5000 mL | Freq: Once | INTRAMUSCULAR | Status: AC
  Administered 2016-09-20: 0.5 mL via INTRAMUSCULAR
  Filled 2016-09-20: qty 0.5

## 2016-09-20 MED ORDER — FLUCONAZOLE 150 MG PO TABS: 150.0000 mg | ORAL_TABLET | Freq: Every day | ORAL | 0 refills | Status: AC

## 2016-09-20 MED ORDER — LIDOCAINE HCL (PF) 1 % IJ SOLN
20.0000 mL | Freq: Once | INTRAMUSCULAR | Status: AC
  Administered 2016-09-20: 20 mL via INTRADERMAL
  Filled 2016-09-20: qty 20

## 2016-09-20 MED ORDER — CEFAZOLIN SODIUM-DEXTROSE 2-4 GM/100ML-% IV SOLN
2.0000 g | Freq: Once | INTRAVENOUS | Status: AC
  Administered 2016-09-20: 2 g via INTRAVENOUS
  Filled 2016-09-20: qty 100

## 2016-09-20 MED ORDER — LIDOCAINE HCL (PF) 1 % IJ SOLN
INTRAMUSCULAR | Status: AC
  Filled 2016-09-20: qty 5

## 2016-09-20 NOTE — ED Triage Notes (Signed)
Patient with right hand significant laceration to index and middle finger. Cut in sausage grinder. Nailbeds and upper digits blue and bruised. Positive distal pulse to hand and can wiggle digits. Saline pressure dressing applied at triage

## 2016-09-20 NOTE — ED Provider Notes (Signed)
[facility]-EMERGENCY DEPT Provider Note   CSN: [id number] Arrival date & time: 09/20/16  1419     History   Chief Complaint Chief Complaint  Patient presents with  . Hand Injury    HPI Elizabeth Garner is a 46 y.o. female.  HPI  46 year old female with history of GERD and graves' disease, who presents status post meat grinding injury to her right hand. She is right-hand dominant. States she was at work when her hand got stuck in the machinery. She has deformities to her second and third fingers on her right hand, with deep lacerations to the ventral surface of his hands that cross over the DIP joint. She has full range of motion of her fingers. Injury occurred at approximately 1 PM. Hemostatic on arrival. She denies prior injury to the area. No other areas of injury or pain.  Past Medical History:  Diagnosis Date  . Barrett's esophagus   . GERD (gastroesophageal reflux disease)   . Graves disease    In "remission"  . PONV (postoperative nausea and vomiting)   . Sleep apnea     Patient Active Problem List   Diagnosis Date Noted  . Abnormal uterine bleeding 01/04/2012    Past Surgical History:  Procedure Laterality Date  . APPENDECTOMY    . CESAREAN SECTION    . CESAREAN SECTION    . COLONOSCOPY    . ESOPHAGOGASTRODUODENOSCOPY    . ESOPHAGOGASTRODUODENOSCOPY (EGD) WITH PROPOFOL N/A 05/06/2015   Procedure: ESOPHAGOGASTRODUODENOSCOPY (EGD) WITH PROPOFOL;  Surgeon: Christena Deem, MD;  Location: [facility] ENDOSCOPY;  Service: Endoscopy;  Laterality: N/A;  . repair and excision eyl\elid      OB History    No data available       Home Medications    Prior to Admission medications   Medication Sig Start Date End Date Taking? Authorizing Provider  acetaminophen (TYLENOL) 500 MG tablet Take 500 mg by mouth every 6 (six) hours as needed (for cramps).    Yes Historical Provider, MD  coconut oil OIL See admin instructions. One tablespoonful mixed with a beverage two times a day    Yes Historical Provider, MD  Flaxseed, Linseed, (FLAX SEED OIL PO) Take 1 capsule by mouth daily.    Yes Historical Provider, MD  fluocinonide gel (LIDEX) 0.05 % Apply 1 application topically daily as needed. FOR ECZEMA   Yes Historical Provider, MD  ibuprofen (ADVIL,MOTRIN) 200 MG tablet Take 400-800 mg by mouth every 6 (six) hours as needed (for monthly cramps).    Yes Historical Provider, MD  levothyroxine (SYNTHROID, LEVOTHROID) 75 MCG tablet Take 75 mcg by mouth daily. 09/14/16  Yes Historical Provider, MD  pantoprazole (PROTONIX) 40 MG tablet Take 40 mg by mouth daily before breakfast. 08/23/14  Yes Historical Provider, MD  phentermine (ADIPEX-P) 37.5 MG tablet Take 1 tablet by mouth daily. 09/14/16 10/14/16 Yes Historical Provider, MD  tetrahydrozoline 0.05 % ophthalmic solution Place 1-2 drops into both eyes 2 (two) times daily as needed (for irritation).    Yes Historical Provider, MD  cephALEXin (KEFLEX) 500 MG capsule Take 1 capsule (500 mg total) by mouth 2 (two) times daily. 09/20/16 09/27/16  Jenifer Ernestina Penna, MD  fluconazole (DIFLUCAN) 150 MG tablet Take 1 tablet (150 mg total) by mouth daily. Take after you complete your course of antibiotics. 09/20/16 09/21/16  Jenifer Ernestina Penna, MD    Family History No family history on file.  Social History Social History  Substance Use Topics  . Smoking status: Never  Smoker  . Smokeless tobacco: Not on file  . Alcohol use 1.2 oz/week    2 Glasses of wine per week     Allergies   Oxycodone; Codeine; Ibuprofen; Other; and Tape   Review of Systems Review of Systems  Constitutional: Negative for chills and fever.  HENT: Negative for facial swelling.   Eyes: Negative for visual disturbance.  Respiratory: Negative for cough and shortness of breath.   Cardiovascular: Negative for chest pain.  Gastrointestinal: Negative for abdominal pain, nausea and vomiting.  Genitourinary: Negative for dysuria.  Musculoskeletal: Positive for  arthralgias (R hand). Negative for back pain, myalgias, neck pain and neck stiffness.  Skin: Positive for wound. Negative for rash.  Neurological: Negative for dizziness, weakness, light-headedness, numbness and headaches.  Psychiatric/Behavioral: Negative for agitation, behavioral problems and confusion.     Physical Exam Updated Vital Signs BP 126/78 (BP Location: Right Arm)   Pulse 90   Temp 97.8 F (36.6 C) (Oral)   Resp 14   LMP 09/07/2016   SpO2 100%   Physical Exam  Constitutional: She appears well-developed and well-nourished. No distress.  HENT:  Head: Normocephalic and atraumatic.  Eyes: Conjunctivae are normal.  Neck: Neck supple.  Cardiovascular: Normal rate, regular rhythm, normal heart sounds and intact distal pulses.   No murmur heard. Pulmonary/Chest: Effort normal and breath sounds normal. No respiratory distress.  Abdominal: Soft. Bowel sounds are normal. There is no tenderness.  Musculoskeletal: She exhibits no edema.  Edema noted to the R 2nd and 3rd digits. Notable deformity over the middle phalynx of the 3rd finger.    Neurological: She is alert.  Full flexion, extension, and lateral movements intact to the R wrist. Full Flexion and extension at the R MCPs, PIPs, and DIP joints with normal strength. R thumb opposition intact. Pt is able to fully abduct and adduct the fingers of the R hand with normal strength. Intact sensation to light touch in the median, ulnar, and radial nerve distributions of the R hand.   Skin: Skin is warm and dry.  2 cm laceration over the ventral aspect of the R 2nd finger that crosses the DIP joint, with exposed adipose tissue but no exposed tendon. 3 cm laceration over the ventral aspect of the R 3rd finger that crosses the DIP joint, with exposed adipose tissue but no exposed tendon. No subungual hematoma. Good capillary refill to the fingers of the R hand. Superficial abrasions over the dorsum of the R 2nd and 3rd fingers.     Psychiatric: She has a normal mood and affect.  Nursing note and vitals reviewed.    ED Treatments / Results  Labs (all labs ordered are listed, but only abnormal results are displayed) Labs Reviewed - No data to display  EKG  EKG Interpretation None       Radiology Dg Hand Complete Right  Result Date: 09/20/2016 CLINICAL DATA:  Laceration To 2nd And 3rd Fingers in Sausage grinding machine EXAM: RIGHT HAND - COMPLETE 3+ VIEW COMPARISON:  None. FINDINGS: Minimally displaced comminuted fracture of the midshaft middle phalanx second digit. Comminuted fracture through the shaft of the middle phalanx third digit with dorsal angulation. Fracture of the distal phalanx third digit. IMPRESSION: 1. Comminuted minimally displaced fracture of the middle phalanx second digit. 2. Oblique fracture through the shaft of the middle phalanx third digit with dorsal angulation and mild comminution 3. Minimally displaced fracture of the distal phalanx third digit. Electronically Signed   By: Loura Halt.D.  On: 09/20/2016 15:06    Procedures .Marland KitchenLaceration Repair Date/Time: 09/21/2016 12:01 AM Performed by: Charlie Pitter Authorized by: Heide Scales   Consent:    Consent obtained:  Verbal   Consent given by:  Patient Anesthesia (see MAR for exact dosages):    Anesthesia method:  Nerve block   Block location:  R 2nd and 3rd digital nerve blocks   Block needle gauge:  25 G   Block anesthetic:  Lidocaine 1% w/o epi   Block technique:  Digital ring blocks   Block injection procedure:  Anatomic landmarks identified, introduced needle, incremental injection and negative aspiration for blood   Block outcome:  Anesthesia achieved Laceration details:    Location:  Finger   Finger location: R index finger- 2 cm; R long finger- 3cm. Repair type:    Repair type:  Intermediate Pre-procedure details:    Preparation:  Patient was prepped and draped in usual sterile  fashion Exploration:    Hemostasis achieved with:  Direct pressure   Wound exploration: wound explored through full range of motion     Wound extent: no tendon damage noted   Treatment:    Area cleansed with:  Saline   Amount of cleaning:  Extensive   Irrigation solution:  Sterile water and tap water   Irrigation method:  Tap and syringe Skin repair:    Repair method:  Sutures   Suture size:  4-0   Suture material:  Prolene   Suture technique:  Simple interrupted Approximation:    Approximation:  Loose Post-procedure details:    Dressing:  Splint for protection   Patient tolerance of procedure:  Tolerated well, no immediate complications   (including critical care time)  Medications Ordered in ED Medications  lidocaine (PF) (XYLOCAINE) 1 % injection (not administered)  ceFAZolin (ANCEF) IVPB 2g/100 mL premix (0 g Intravenous Stopped 09/20/16 1915)  Tdap (BOOSTRIX) injection 0.5 mL (0.5 mLs Intramuscular Given 09/20/16 1702)  lidocaine (PF) (XYLOCAINE) 1 % injection 20 mL (20 mLs Intradermal Given by Other 09/20/16 1830)     Initial Impression / Assessment and Plan / ED Course  I have reviewed the triage vital signs and the nursing notes.  Pertinent labs & imaging results that were available during my care of the patient were reviewed by me and considered in my medical decision making (see chart for details).     Patient has swelling to the right second and third fingers, with a notable deformity over the right third proximal phalanx. She has lacerations to the ventral aspect of the second and third fingers that are hemostatic on arrival. Motor and sensory exam intact, with no exposed tendon.  X-ray of the right hand reveals comminuted minimally displaced fracture of the right middle phalanx of the second digit, and an oblique fracture through the shaft of the middle phalanx of the third digit with dorsal angulation and mild comminution. Patient also has a minimally displaced  fracture of the distal phalanx of the third digit. Given open fractures, she was given Tdap as well as ancef.  Case discussed with Dr. Janee Morn with hand surgery, who recommends thoroughly irrigating the patient's wounds, repairing her lacerations, and follow-up with him in clinic within the next few days for definitive management which will likely include surgery. After extensive irrigation, lacerations were repaired according to the procedure note above and patient splinted by ortho tech. Patient's hand remains neurovascularly intact following placement of splint. She was discharged in stable condition with hand surgery follow-up. Prescription  for 1 week course of Keflex prescribed per Dr. Carollee Massedhompson's recommendations. Patient states that she does not like pain medications and does not wish to be prescribed any prescription pain medications. States that she will take ibuprofen and Tylenol for her pain. Return precautions discussed.   Care of patient overseen by my attending, Dr. Rush Landmarkegeler.   Final Clinical Impressions(s) / ED Diagnoses   Final diagnoses:  Closed fractures of multiple sites of phalanx of finger of right hand, initial encounter  Laceration of finger of right hand without foreign body without damage to nail, unspecified finger, initial encounter    New Prescriptions Discharge Medication List as of 09/20/2016  9:22 PM    START taking these medications   Details  cephALEXin (KEFLEX) 500 MG capsule Take 1 capsule (500 mg total) by mouth 2 (two) times daily., Starting Thu 09/20/2016, Until Thu 09/27/2016, Print         Jenifer Ernestina PennaBrunno Irick, MD 09/21/16 [id number]    Canary Brimhristopher J Tegeler, MD 09/25/16 [phone]

## 2016-09-20 NOTE — Progress Notes (Signed)
Orthopedic Tech Progress Note Patient Details:  Elizabeth SnellenDebbie P Garner Aug 30, 1970 [id number]  Ortho Devices Type of Ortho Device: Arm sling, Finger splint Ortho Device/Splint Location: applied finger splint to right hand/ 2nd and 3rd fingers.  pt tolerated application well. Provided arm sling for support. family at bedside. right hand Ortho Device/Splint Interventions: Application   Alvina ChouWilliams, Kolt Mcwhirter C 09/20/2016, 9:11 PM

## 2016-09-24 ENCOUNTER — Other Ambulatory Visit: Payer: Self-pay | Admitting: Orthopedic Surgery

## 2016-09-24 NOTE — H&P (Signed)
Elizabeth Garner is an 46 y.o. female.   CC / Reason for Visit: Right index and long finger injuries HPI: This patient is a 46 year old RHD female who presents for evaluation of an injury that occurred her right index and long fingers on Thursday.  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  She is evaluated in emergency department, where digital block was performed, the wounds irrigated and closed, the fracture gently provisionally reduced and splinted.  She has remained on her antibiotics.  Past Medical History:  Diagnosis Date  . Barrett's esophagus   . GERD (gastroesophageal reflux disease)   . Graves disease    In "remission"  . PONV (postoperative nausea and vomiting)   . Sleep apnea     Past Surgical History:  Procedure Laterality Date  . APPENDECTOMY    . CESAREAN SECTION    . CESAREAN SECTION    . COLONOSCOPY    . ESOPHAGOGASTRODUODENOSCOPY    . ESOPHAGOGASTRODUODENOSCOPY (EGD) WITH PROPOFOL N/A 05/06/2015   Procedure: ESOPHAGOGASTRODUODENOSCOPY (EGD) WITH PROPOFOL;  Surgeon: Christena Deem, MD;  [facility];  Service: Endoscopy;  Laterality: N/A;  . repair and excision eyl\elid      No family history on file. Social History:  reports that she has never smoked. She does not have any smokeless tobacco history on file. She reports that she drinks about 1.2 oz of alcohol per week . She reports that she does not use drugs.  Allergies:  Allergies  Allergen Reactions  . Oxycodone Shortness Of Breath, Nausea And Vomiting and Other (See Comments)    Makes patient very lethargic also  . Codeine Nausea And Vomiting and Other (See Comments)    Patient states it makes her sick, violently nauseous, dizzy   . Ibuprofen Other (See Comments)    Stomach irritation  . Other Nausea And Vomiting    Pain meds (in general): Makes the patient very lethargic (also)  . Tape Rash    Patient already has eczema     No prescriptions prior to admission.    No results found for this or any previous visit (from the past 48 hour(s)). No results found.  Review of Systems  All other systems reviewed and are negative.   Last menstrual period 09/07/2016. Physical Exam  Constitutional:  WD, WN, NAD HEENT:  NCAT, EOMI Neuro/Psych:  Alert & oriented to person, place, and time; appropriate mood & affect Lymphatic: No generalized UE edema or lymphadenopathy Extremities / MSK:  Both UE are normal with respect to appearance, ranges of motion, joint stability, muscle strength/tone, sensation, & perfusion except as otherwise noted:  Dressings are removed.  The volar wounds on the surfaces of the index and long fingers are healing well without evidence for infection.  The fingers are moderately swollen.  There is some gross angulation of the long finger through the middle phalanx, the index finger appears grossly well aligned.  With attempts at flexion, it would appear that the flexor and extensor tendons are intact.  She has intact light touch sensibility on the radial and ulnar aspects of both digits  Labs / Xrays:  No radiographic studies obtained today.  Previous x-rays are reviewed, revealing a fracture through the distal diaphysis of the middle phalanx of the right long finger with significant displacement, and a mildly displaced fracture through the middle phalanx of the index finger  Assessment: Right index and long finger middle phalangeal fractures with  associated volar wounds  Plan:  I discussed these findings with her and recommended operative treatment with reduction and surgical stabilization of the middle phalangeal fractures.  I would like to accomplish this percutaneously with pins, but could resort to plating if necessary.  The details of the operative procedure were discussed with the patient.  Questions were invited and answered.  In addition to the goal of the procedure, the risks of the  procedure to include but not limited to bleeding; infection; damage to the nerves or blood vessels that could result in bleeding, numbness, weakness, chronic pain, and the need for additional procedures; stiffness; the need for revision surgery; and anesthetic risks were reviewed.  No specific outcome was guaranteed or implied.  Informed consent was obtained.  Kam Kushnir A., MD 09/24/2016, 5:45 PM

## 2016-09-25 ENCOUNTER — Ambulatory Visit (HOSPITAL_BASED_OUTPATIENT_CLINIC_OR_DEPARTMENT_OTHER): Payer: Worker's Compensation | Admitting: Anesthesiology

## 2016-09-25 ENCOUNTER — Encounter (HOSPITAL_BASED_OUTPATIENT_CLINIC_OR_DEPARTMENT_OTHER): Payer: Self-pay | Admitting: Anesthesiology

## 2016-09-25 ENCOUNTER — Ambulatory Visit (HOSPITAL_BASED_OUTPATIENT_CLINIC_OR_DEPARTMENT_OTHER)
Admission: RE | Admit: 2016-09-25 | Discharge: 2016-09-25 | Disposition: A | Discharge status: Home / Self Care | Payer: Worker's Compensation | Source: Ambulatory Visit | Attending: Orthopedic Surgery | Admitting: Orthopedic Surgery

## 2016-09-25 ENCOUNTER — Ambulatory Visit (HOSPITAL_COMMUNITY): Payer: Worker's Compensation

## 2016-09-25 ENCOUNTER — Encounter (HOSPITAL_BASED_OUTPATIENT_CLINIC_OR_DEPARTMENT_OTHER)
Admission: RE | Disposition: A | Discharge status: Home / Self Care | Payer: Self-pay | Source: Ambulatory Visit | Attending: Orthopedic Surgery

## 2016-09-25 DIAGNOSIS — Z885 Allergy status to narcotic agent status: Secondary | ICD-10-CM | POA: Diagnosis not present

## 2016-09-25 DIAGNOSIS — S62620A Displaced fracture of medial phalanx of right index finger, initial encounter for closed fracture: Secondary | ICD-10-CM | POA: Diagnosis not present

## 2016-09-25 DIAGNOSIS — L309 Dermatitis, unspecified: Secondary | ICD-10-CM | POA: Diagnosis not present

## 2016-09-25 DIAGNOSIS — S61212A Laceration without foreign body of right middle finger without damage to nail, initial encounter: Secondary | ICD-10-CM | POA: Diagnosis not present

## 2016-09-25 DIAGNOSIS — Z886 Allergy status to analgesic agent status: Secondary | ICD-10-CM | POA: Diagnosis not present

## 2016-09-25 DIAGNOSIS — S62609A Fracture of unspecified phalanx of unspecified finger, initial encounter for closed fracture: Secondary | ICD-10-CM

## 2016-09-25 DIAGNOSIS — K219 Gastro-esophageal reflux disease without esophagitis: Secondary | ICD-10-CM | POA: Insufficient documentation

## 2016-09-25 DIAGNOSIS — W3189XA Contact with other specified machinery, initial encounter: Secondary | ICD-10-CM | POA: Insufficient documentation

## 2016-09-25 DIAGNOSIS — S61210A Laceration without foreign body of right index finger without damage to nail, initial encounter: Secondary | ICD-10-CM | POA: Diagnosis not present

## 2016-09-25 DIAGNOSIS — S62622A Displaced fracture of medial phalanx of right middle finger, initial encounter for closed fracture: Secondary | ICD-10-CM | POA: Insufficient documentation

## 2016-09-25 HISTORY — PX: CLOSED REDUCTION FINGER WITH PERCUTANEOUS PINNING: SHX5612

## 2016-09-25 HISTORY — PX: SUTURE REMOVAL: SHX6354

## 2016-09-25 LAB — POCT PREGNANCY, URINE: Preg Test, Ur: NEGATIVE

## 2016-09-25 SURGERY — REMOVAL, SUTURE
Anesthesia: General | Site: Finger | Laterality: Right

## 2016-09-25 MED ORDER — MIDAZOLAM HCL 5 MG/5ML IJ SOLN
INTRAMUSCULAR | Status: DC | PRN
  Administered 2016-09-25: 2 mg via INTRAVENOUS

## 2016-09-25 MED ORDER — LACTATED RINGERS IV SOLN
INTRAVENOUS | Status: DC
Start: 2016-09-25 — End: 2016-09-25
  Administered 2016-09-25 (×2): via INTRAVENOUS

## 2016-09-25 MED ORDER — TRAMADOL HCL 50 MG PO TABS: 50.0000 mg | ORAL_TABLET | Freq: Four times a day (QID) | ORAL | 0 refills | Status: DC | PRN

## 2016-09-25 MED ORDER — MELOXICAM 7.5 MG PO TABS: ORAL_TABLET | ORAL | 1 refills | Status: DC

## 2016-09-25 MED ORDER — SCOPOLAMINE 1 MG/3DAYS TD PT72: 1.0000 | MEDICATED_PATCH | Freq: Once | TRANSDERMAL | Status: DC | PRN

## 2016-09-25 MED ORDER — FENTANYL CITRATE (PF) 100 MCG/2ML IJ SOLN: 50.0000 ug | INTRAMUSCULAR | Status: DC | PRN

## 2016-09-25 MED ORDER — FENTANYL CITRATE (PF) 100 MCG/2ML IJ SOLN
INTRAMUSCULAR | Status: AC
  Filled 2016-09-25: qty 2

## 2016-09-25 MED ORDER — DEXAMETHASONE SODIUM PHOSPHATE 4 MG/ML IJ SOLN
INTRAMUSCULAR | Status: DC | PRN
  Administered 2016-09-25: 10 mg via INTRAVENOUS

## 2016-09-25 MED ORDER — LIDOCAINE 2% (20 MG/ML) 5 ML SYRINGE
INTRAMUSCULAR | Status: AC
  Filled 2016-09-25: qty 5

## 2016-09-25 MED ORDER — MIDAZOLAM HCL 2 MG/2ML IJ SOLN: 1.0000 mg | INTRAMUSCULAR | Status: DC | PRN

## 2016-09-25 MED ORDER — DEXAMETHASONE SODIUM PHOSPHATE 10 MG/ML IJ SOLN
INTRAMUSCULAR | Status: AC
  Filled 2016-09-25: qty 1

## 2016-09-25 MED ORDER — HYDROMORPHONE HCL 1 MG/ML IJ SOLN: 0.2500 mg | INTRAMUSCULAR | Status: DC | PRN

## 2016-09-25 MED ORDER — FENTANYL CITRATE (PF) 100 MCG/2ML IJ SOLN
INTRAMUSCULAR | Status: DC | PRN
  Administered 2016-09-25: 25 ug via INTRAVENOUS
  Administered 2016-09-25: 50 ug via INTRAVENOUS
  Administered 2016-09-25: 25 ug via INTRAVENOUS
  Administered 2016-09-25: 100 ug via INTRAVENOUS

## 2016-09-25 MED ORDER — SCOPOLAMINE 1 MG/3DAYS TD PT72
MEDICATED_PATCH | TRANSDERMAL | Status: AC
  Filled 2016-09-25: qty 1

## 2016-09-25 MED ORDER — MIDAZOLAM HCL 2 MG/2ML IJ SOLN
INTRAMUSCULAR | Status: AC
  Filled 2016-09-25: qty 2

## 2016-09-25 MED ORDER — BUPIVACAINE HCL (PF) 0.5 % IJ SOLN
INTRAMUSCULAR | Status: DC | PRN
Start: 2016-09-25 — End: 2016-09-25
  Administered 2016-09-25: 10 mL

## 2016-09-25 MED ORDER — ONDANSETRON HCL 4 MG/2ML IJ SOLN
INTRAMUSCULAR | Status: AC
  Filled 2016-09-25: qty 2

## 2016-09-25 MED ORDER — LACTATED RINGERS IV SOLN
INTRAVENOUS | Status: DC
  Administered 2016-09-25: 14:00:00 via INTRAVENOUS

## 2016-09-25 MED ORDER — PROPOFOL 10 MG/ML IV BOLUS
INTRAVENOUS | Status: AC
  Filled 2016-09-25: qty 20

## 2016-09-25 MED ORDER — SCOPOLAMINE 1 MG/3DAYS TD PT72
MEDICATED_PATCH | TRANSDERMAL | Status: DC | PRN
  Administered 2016-09-25: 1 via TRANSDERMAL

## 2016-09-25 MED ORDER — ONDANSETRON HCL 4 MG PO TABS: 4.0000 mg | ORAL_TABLET | Freq: Three times a day (TID) | ORAL | 0 refills | Status: AC | PRN

## 2016-09-25 MED ORDER — KETOROLAC TROMETHAMINE 30 MG/ML IJ SOLN
INTRAMUSCULAR | Status: AC
  Filled 2016-09-25: qty 1

## 2016-09-25 MED ORDER — CEFAZOLIN SODIUM-DEXTROSE 2-4 GM/100ML-% IV SOLN
2.0000 g | INTRAVENOUS | Status: AC
  Administered 2016-09-25: 2 g via INTRAVENOUS

## 2016-09-25 MED ORDER — PROMETHAZINE HCL 25 MG/ML IJ SOLN: 6.2500 mg | INTRAMUSCULAR | Status: DC | PRN

## 2016-09-25 MED ORDER — LIDOCAINE HCL (CARDIAC) 20 MG/ML IV SOLN
INTRAVENOUS | Status: DC | PRN
  Administered 2016-09-25: 30 mg via INTRAVENOUS

## 2016-09-25 MED ORDER — PROPOFOL 10 MG/ML IV BOLUS
INTRAVENOUS | Status: DC | PRN
  Administered 2016-09-25: 200 mg via INTRAVENOUS

## 2016-09-25 MED ORDER — MEPERIDINE HCL 25 MG/ML IJ SOLN: 6.2500 mg | INTRAMUSCULAR | Status: DC | PRN

## 2016-09-25 MED ORDER — CEFAZOLIN SODIUM-DEXTROSE 2-4 GM/100ML-% IV SOLN
INTRAVENOUS | Status: AC
  Filled 2016-09-25: qty 100

## 2016-09-25 SURGICAL SUPPLY — 48 items
BANDAGE COBAN STERILE 2 (GAUZE/BANDAGES/DRESSINGS) IMPLANT
BLADE SURG 15 STRL LF DISP TIS (BLADE) ×1 IMPLANT
BLADE SURG 15 STRL SS (BLADE) ×3
BNDG CMPR 9X4 STRL LF SNTH (GAUZE/BANDAGES/DRESSINGS) ×1
BNDG COHESIVE 1X5 TAN STRL LF (GAUZE/BANDAGES/DRESSINGS) ×2 IMPLANT
BNDG COHESIVE 4X5 TAN STRL (GAUZE/BANDAGES/DRESSINGS) IMPLANT
BNDG CONFORM 2 STRL LF (GAUZE/BANDAGES/DRESSINGS) ×2 IMPLANT
BNDG ESMARK 4X9 LF (GAUZE/BANDAGES/DRESSINGS) ×2 IMPLANT
BNDG GAUZE ELAST 4 BULKY (GAUZE/BANDAGES/DRESSINGS) ×3 IMPLANT
CHLORAPREP W/TINT 26ML (MISCELLANEOUS) ×3 IMPLANT
CORDS BIPOLAR (ELECTRODE) IMPLANT
COVER BACK TABLE 60X90IN (DRAPES) ×3 IMPLANT
COVER MAYO STAND STRL (DRAPES) ×3 IMPLANT
CUFF TOURNIQUET SINGLE 18IN (TOURNIQUET CUFF) ×2 IMPLANT
DRAPE C-ARM 42X72 X-RAY (DRAPES) ×3 IMPLANT
DRAPE EXTREMITY T 121X128X90 (DRAPE) ×3 IMPLANT
DRAPE SURG 17X23 STRL (DRAPES) ×3 IMPLANT
DRSG EMULSION OIL 3X3 NADH (GAUZE/BANDAGES/DRESSINGS) ×2 IMPLANT
GAUZE SPONGE 4X4 12PLY STRL LF (GAUZE/BANDAGES/DRESSINGS) ×3 IMPLANT
GLOVE BIO SURGEON STRL SZ7.5 (GLOVE) ×3 IMPLANT
GLOVE BIOGEL PI IND STRL 7.0 (GLOVE) ×1 IMPLANT
GLOVE BIOGEL PI IND STRL 8 (GLOVE) ×1 IMPLANT
GLOVE BIOGEL PI INDICATOR 7.0 (GLOVE) ×4
GLOVE BIOGEL PI INDICATOR 8 (GLOVE) ×2
GLOVE ECLIPSE 6.5 STRL STRAW (GLOVE) ×3 IMPLANT
GLOVE SURG SS PI 7.0 STRL IVOR (GLOVE) ×2 IMPLANT
GOWN STRL REUS W/ TWL LRG LVL3 (GOWN DISPOSABLE) ×2 IMPLANT
GOWN STRL REUS W/TWL LRG LVL3 (GOWN DISPOSABLE) ×6
GOWN STRL REUS W/TWL XL LVL3 (GOWN DISPOSABLE) ×3 IMPLANT
K-WIRE .045X4 (WIRE) ×6 IMPLANT
NDL HYPO 25X1 1.5 SAFETY (NEEDLE) IMPLANT
NEEDLE HYPO 25X1 1.5 SAFETY (NEEDLE) ×3 IMPLANT
NS IRRIG 1000ML POUR BTL (IV SOLUTION) ×3 IMPLANT
PACK BASIN DAY SURGERY FS (CUSTOM PROCEDURE TRAY) ×3 IMPLANT
PADDING CAST ABS 4INX4YD NS (CAST SUPPLIES)
PADDING CAST ABS COTTON 4X4 ST (CAST SUPPLIES) IMPLANT
PADDING UNDERCAST 2 STRL (CAST SUPPLIES)
PADDING UNDERCAST 2X4 STRL (CAST SUPPLIES) IMPLANT
SLEEVE SCD COMPRESS KNEE MED (MISCELLANEOUS) ×2 IMPLANT
STOCKINETTE 6  STRL (DRAPES) ×2
STOCKINETTE 6 STRL (DRAPES) ×1 IMPLANT
SUT VICRYL RAPIDE 4-0 (SUTURE) IMPLANT
SUT VICRYL RAPIDE 4/0 PS 2 (SUTURE) ×2 IMPLANT
SYR 10ML LL (SYRINGE) ×2 IMPLANT
SYR BULB 3OZ (MISCELLANEOUS) IMPLANT
TOWEL OR 17X24 6PK STRL BLUE (TOWEL DISPOSABLE) ×3 IMPLANT
TOWEL OR NON WOVEN STRL DISP B (DISPOSABLE) ×3 IMPLANT
UNDERPAD 30X30 (UNDERPADS AND DIAPERS) ×3 IMPLANT

## 2016-09-25 NOTE — Anesthesia Procedure Notes (Signed)
Procedure Name: LMA Insertion Date/Time: 09/25/2016 3:09 PM Performed by: Genevieve Norlander L Pre-anesthesia Checklist: Patient identified, Emergency Drugs available, Suction available, Patient being monitored and Timeout performed Patient Re-evaluated:Patient Re-evaluated prior to inductionOxygen Delivery Method: Circle system utilized Preoxygenation: Pre-oxygenation with 100% oxygen Intubation Type: IV induction Ventilation: Mask ventilation without difficulty LMA: LMA inserted LMA Size: 4.0 Number of attempts: 1 Airway Equipment and Method: Bite block Placement Confirmation: positive ETCO2 Tube secured with: Tape Dental Injury: Teeth and Oropharynx as per pre-operative assessment

## 2016-09-25 NOTE — Anesthesia Postprocedure Evaluation (Signed)
Anesthesia Post Note  Patient: Elizabeth Garner  Procedure(s) Performed: Procedure(s) (LRB): SUTURE REMOVAL AND CLOSURE RIGHT HAND LACERATION (Right) CLOSED PINNING OF RIGHT INDEX AND LONG FINGERS PHALANGES FRACTURES (Right)  Patient location during evaluation: PACU Anesthesia Type: General Level of consciousness: awake and alert Pain management: pain level controlled Vital Signs Assessment: post-procedure vital signs reviewed and stable Respiratory status: spontaneous breathing, nonlabored ventilation and respiratory function stable Cardiovascular status: blood pressure returned to baseline and stable Postop Assessment: no signs of nausea or vomiting Anesthetic complications: no       Last Vitals:  Vitals:   09/25/16 1615 09/25/16 1630  BP: 119/82 131/79  Pulse: 82 76  Resp: 20 18  Temp:      Last Pain:  Vitals:   09/25/16 1637  TempSrc:   PainSc: 2                  Lowella Curb

## 2016-09-25 NOTE — Interval H&P Note (Signed)
History and Physical Interval Note:  09/25/2016 1:11 PM  Elizabeth Garner  has presented today for surgery, with the diagnosis of RIGHT INDEX AND LONG FINGER MIDDLE PHALANGES FRACTURE S62.622B,  S62.650B  The various methods of treatment have been discussed with the patient and family. After consideration of risks, benefits and other options for treatment, the patient has consented to  Procedure(s): CLOSED PINNING  VS.  OPENT TREATMENT OF RIGHT INDEX AND LONG FINGERS MIDDLE PHALANGES FRACTURES (Right) as a surgical intervention .  The patient's history has been reviewed, patient examined, no change in status, stable for surgery.  I have reviewed the patient's chart and labs.  Questions were answered to the patient's satisfaction.     Mann Skaggs A.

## 2016-09-25 NOTE — Anesthesia Preprocedure Evaluation (Signed)
Anesthesia Evaluation  Patient identified by MRN, date of birth, ID band Patient awake    Reviewed: Allergy & Precautions, H&P , Patient's Chart, lab work & pertinent test results  History of Anesthesia Complications (+) PONV and history of anesthetic complications  Airway Mallampati: II  TM Distance: >3 FB Neck ROM: full    Dental no notable dental hx.    Pulmonary neg pulmonary ROS,    Pulmonary exam normal breath sounds clear to auscultation       Cardiovascular Exercise Tolerance: Good negative cardio ROS   Rhythm:regular Rate:Normal     Neuro/Psych negative neurological ROS  negative psych ROS   GI/Hepatic negative GI ROS, Neg liver ROS, GERD  ,  Endo/Other  negative endocrine ROSHypothyroidism   Renal/GU negative Renal ROS     Musculoskeletal   Abdominal   Peds  Hematology negative hematology ROS (+)   Anesthesia Other Findings  PONV (postoperative nausea and vomiting)     Graves disease   In "remission"   Reproductive/Obstetrics negative OB ROS                             Anesthesia Physical  Anesthesia Plan  ASA: II  Anesthesia Plan: General LMA   Post-op Pain Management:    Induction:   Airway Management Planned:   Additional Equipment:   Intra-op Plan:   Post-operative Plan:   Informed Consent: I have reviewed the patients History and Physical, chart, labs and discussed the procedure including the risks, benefits and alternatives for the proposed anesthesia with the patient or authorized representative who has indicated his/her understanding and acceptance.   Dental Advisory Given  Plan Discussed with: CRNA, Surgeon and Anesthesiologist  Anesthesia Plan Comments:         Anesthesia Quick Evaluation

## 2016-09-25 NOTE — Op Note (Signed)
09/25/2016  1:12 PM  PATIENT:  Elizabeth Garner  46 y.o. female  PRE-OPERATIVE DIAGNOSIS:  Displaced diaphyseal fractures of the right index and long middle phalanges  POST-OPERATIVE DIAGNOSIS:  Same  PROCEDURE:   1.  Removal of sutures from right index and long fingers    2.  Excisional debridement of wounds of right index and long fingers, including skin and subcutaneous cutaneous tissue (3.5cm total)    3.  Simple closure of traumatic wounds of right index and long fingers, 3.5cm total     4. ORIF R LF P2 fx    5. CRPP R IF P2 fx  SURGEON: Bethanie Bloxom A. Janee Morn, MD  PHYSICIAN ASSISTANT: Danielle Rankin, OPA-C  ANESTHESIA:  general  SPECIMENS:  None  DRAINS:   None  EBL:  less than 50 mL  PREOPERATIVE INDICATIONS:  [name] is a  46 y.o. female with displaced fractures of the right index and long finger middle phalanges.  The risks benefits and alternatives were discussed with the patient preoperatively including but not limited to the risks of infection, bleeding, nerve injury, cardiopulmonary complications, the need for revision surgery, among others, and the patient verbalized understanding and consented to proceed.  OPERATIVE IMPLANTS: 0.045 inch kwires  OPERATIVE PROCEDURE:  After receiving prophylactic antibiotics, the patient was escorted to the operative theatre and placed in a supine position.  General anesthesia was administered.  A surgical "time-out" was performed during which the planned procedure, proposed operative site, and the correct patient identity were compared to the operative consent and agreement confirmed by the circulating nurse according to current facility policy.  Following application of a tourniquet to the operative extremity, the exposed skin was prepped with Chloraprep and draped in the usual sterile fashion.  The limb was exsanguinated with an Esmarch bandage and the tourniquet inflated to approximately higher than systolic BP.  Sutures  were removed from both fingers.  The wounds were then assessed, and the neurovascular bundles were found to be intact in both digits.  The nonviable skin margins and subcutaneous tissues were debrided excisionally with scissors and the wounds irrigated.  Attention was then shifted to fracture fixation.  The long finger was addressed first.  Closed manipulation was not possible, so a Therapist, nutritional was inserted into the wound and used to help gain reduction of the middle phalanx fracture.  With the fracture held reduced, it was secured with crossed K wires.  The reduction was not yet stable enough, so a longitudinal K wire was driven from the end of the digit across the DIP joint but not crossing the PIP joint.  In total, 3 K wires were then inserted.  They were all bent over at the skin and clipped.  This provided for good alignment and stability of the distal fracture of the middle phalanx.  The index finger was addressed next, and the fracture was held reduced in a closed fashion, secured by crossed her cutaneously driven K wires.  These were allowed to exit distally, bent over 90 and clipped.  Final fluoroscopic images were obtained revealing satisfactory reduction and stabilization of both fractures.    The wounds were again irrigated and having already been excisionally debrided, were reapproximated with 4-0 Vicryl Rapide interrupted sutures.  Half percent Marcaine without epinephrine was instilled at the base of both digits to provide for postoperative pain relief.  The fingers were then dressed with Adaptic, gauze, roller gauze with a dorsal tongue blade component and Coban and down  to the base of the digit.  This allowed for MP and PIP flexion.  Tourniquet was released, and she was awakened and taken to the recovery room in stable condition, breathing spontaneously  DISPOSITION: She'll be discharged home today with typical instructions, returning to therapy in 3-7 days for custom splint fabrication  and initiation of rehabilitation of the digits, allowing MP and PIP motion.  RTC 10-15 days with me with new x-rays of both digits out of the splint.

## 2016-09-25 NOTE — Discharge Instructions (Addendum)
Discharge Instructions   You have a dressing with a splint incorporated in it. Move your fingers as much as possible, making a full fist and fully opening the fist. Elevate your hand to reduce pain & swelling of the digits.  Ice over the operative site may be helpful to reduce pain & swelling.  DO NOT USE HEAT. Pain medicine has been prescribed for you.  Leave the dressing in place until you return to our office.  You may shower, but keep the bandage clean & dry.  You may drive a car when you are off of prescription pain medications and can safely control your vehicle with both hands. Our office will call you to arrange follow-up     Post Anesthesia Home Care Instructions  Activity: Get plenty of rest for the remainder of the day. A responsible individual must stay with you for 24 hours following the procedure.  For the next 24 hours, DO NOT: -Drive a car -Advertising copywriter -Drink alcoholic beverages -Take any medication unless instructed by your physician -Make any legal decisions or sign important papers.  Meals: Start with liquid foods such as gelatin or soup. Progress to regular foods as tolerated. Avoid greasy, spicy, heavy foods. If nausea and/or vomiting occur, drink only clear liquids until the nausea and/or vomiting subsides. Call your physician if vomiting continues.  Special Instructions/Symptoms: Your throat may feel dry or sore from the anesthesia or the breathing tube placed in your throat during surgery. If this causes discomfort, gargle with warm salt water. The discomfort should disappear within 24 hours.  If you had a scopolamine patch placed behind your ear for the management of post- operative nausea and/or vomiting:  1. The medication in the patch is effective for 72 hours, after which it should be removed.  Wrap patch in a tissue and discard in the trash. Wash hands thoroughly with soap and water. 2. You may remove the patch earlier than 72 hours if you  experience unpleasant side effects which may include dry mouth, dizziness or visual disturbances. 3. Avoid touching the patch. Wash your hands with soap and water after contact with the patch.     Please call [phone] during normal business hours or [phone] after hours for any problems. Including the following:  - excessive redness of the incisions - drainage for more than 4 days - fever of more than 101.5 F  *Please note that pain medications will not be refilled after hours or on weekends.  WORK STATUS: NO WORK THIS WEEK.  MAY RETURN TO WORK ON Monday 10-01-16, BUT WITH NO WORK USING THE RIGHT HAND

## 2016-09-25 NOTE — Transfer of Care (Signed)
Immediate Anesthesia Transfer of Care Note  Patient: Elizabeth Garner  Procedure(s) Performed: Procedure(s): SUTURE REMOVAL RIGHT HAND (Right) CLOSED PINNING OF RIGHT INDEX AND LONG FINGERS PHALANGES FRACTURES (Right)  Patient Location: PACU  Anesthesia Type:General  Level of Consciousness: awake and patient cooperative  Airway & Oxygen Therapy: Patient Spontanous Breathing and Patient connected to face mask oxygen  Post-op Assessment: Report given to RN and Post -op Vital signs reviewed and stable  Post vital signs: Reviewed and stable  Last Vitals:  Vitals:   09/25/16 1258 09/25/16 1609  BP: 123/77   Pulse: 81   Resp: 18   Temp: 36.8 C (P) 36.5 C    Last Pain:  Vitals:   09/25/16 1258  TempSrc: Oral  PainSc: 2       Patients Stated Pain Goal: 2 (09/25/16 1258)  Complications: No apparent anesthesia complications

## 2016-09-26 ENCOUNTER — Encounter (HOSPITAL_BASED_OUTPATIENT_CLINIC_OR_DEPARTMENT_OTHER): Payer: Self-pay | Admitting: Orthopedic Surgery

## 2017-02-07 ENCOUNTER — Encounter: Payer: Self-pay | Admitting: *Deleted

## 2017-02-08 ENCOUNTER — Ambulatory Visit: Payer: 59 | Admitting: Anesthesiology

## 2017-02-08 ENCOUNTER — Ambulatory Visit
Admission: RE | Admit: 2017-02-08 | Discharge: 2017-02-08 | Disposition: A | Discharge status: Home / Self Care | Payer: 59 | Source: Ambulatory Visit | Attending: Gastroenterology | Admitting: Gastroenterology

## 2017-02-08 ENCOUNTER — Encounter
Admission: RE | Disposition: A | Discharge status: Home / Self Care | Payer: Self-pay | Source: Ambulatory Visit | Attending: Gastroenterology

## 2017-02-08 ENCOUNTER — Encounter: Payer: Self-pay | Admitting: Anesthesiology

## 2017-02-08 DIAGNOSIS — K210 Gastro-esophageal reflux disease with esophagitis: Secondary | ICD-10-CM | POA: Diagnosis not present

## 2017-02-08 DIAGNOSIS — K317 Polyp of stomach and duodenum: Secondary | ICD-10-CM | POA: Diagnosis not present

## 2017-02-08 DIAGNOSIS — K2970 Gastritis, unspecified, without bleeding: Secondary | ICD-10-CM | POA: Diagnosis not present

## 2017-02-08 DIAGNOSIS — K2270 Barrett's esophagus without dysplasia: Secondary | ICD-10-CM | POA: Diagnosis present

## 2017-02-08 HISTORY — PX: ESOPHAGOGASTRODUODENOSCOPY (EGD) WITH PROPOFOL: SHX5813

## 2017-02-08 LAB — POCT PREGNANCY, URINE: Preg Test, Ur: NEGATIVE

## 2017-02-08 SURGERY — ESOPHAGOGASTRODUODENOSCOPY (EGD) WITH PROPOFOL
Anesthesia: General

## 2017-02-08 MED ORDER — SODIUM CHLORIDE 0.9 % IV SOLN: INTRAVENOUS | Status: DC

## 2017-02-08 MED ORDER — FENTANYL CITRATE (PF) 100 MCG/2ML IJ SOLN
INTRAMUSCULAR | Status: AC
  Filled 2017-02-08: qty 2

## 2017-02-08 MED ORDER — GLYCOPYRROLATE 0.2 MG/ML IJ SOLN
INTRAMUSCULAR | Status: DC | PRN
  Administered 2017-02-08: 0.1 mg via INTRAVENOUS

## 2017-02-08 MED ORDER — LIDOCAINE HCL (CARDIAC) 20 MG/ML IV SOLN
INTRAVENOUS | Status: DC | PRN
  Administered 2017-02-08: 30 mg via INTRAVENOUS

## 2017-02-08 MED ORDER — BUTAMBEN-TETRACAINE-BENZOCAINE 2-2-14 % EX AERO
2.0000 | INHALATION_SPRAY | Freq: Once | CUTANEOUS | Status: AC
  Administered 2017-02-08: 2 via TOPICAL

## 2017-02-08 MED ORDER — PROPOFOL 500 MG/50ML IV EMUL
INTRAVENOUS | Status: AC
  Filled 2017-02-08: qty 50

## 2017-02-08 MED ORDER — MIDAZOLAM HCL 2 MG/2ML IJ SOLN
INTRAMUSCULAR | Status: DC | PRN
  Administered 2017-02-08: 2 mg via INTRAVENOUS

## 2017-02-08 MED ORDER — MIDAZOLAM HCL 2 MG/2ML IJ SOLN
INTRAMUSCULAR | Status: AC
  Filled 2017-02-08: qty 2

## 2017-02-08 MED ORDER — SODIUM CHLORIDE 0.9 % IV SOLN
INTRAVENOUS | Status: DC
  Administered 2017-02-08: 11:00:00 via INTRAVENOUS

## 2017-02-08 MED ORDER — ONDANSETRON HCL 4 MG/2ML IJ SOLN
INTRAMUSCULAR | Status: DC | PRN
  Administered 2017-02-08: 4 mg via INTRAVENOUS

## 2017-02-08 MED ORDER — GLYCOPYRROLATE 0.2 MG/ML IJ SOLN
INTRAMUSCULAR | Status: AC
  Filled 2017-02-08: qty 1

## 2017-02-08 MED ORDER — LIDOCAINE HCL (PF) 2 % IJ SOLN
INTRAMUSCULAR | Status: AC
  Filled 2017-02-08: qty 2

## 2017-02-08 MED ORDER — PROPOFOL 500 MG/50ML IV EMUL
INTRAVENOUS | Status: DC | PRN
  Administered 2017-02-08: 100 ug/kg/min via INTRAVENOUS

## 2017-02-08 MED ORDER — FENTANYL CITRATE (PF) 100 MCG/2ML IJ SOLN
INTRAMUSCULAR | Status: DC | PRN
  Administered 2017-02-08: 50 ug via INTRAVENOUS

## 2017-02-08 NOTE — H&P (Signed)
Outpatient short stay form Pre-procedure 02/08/2017 11:09 AM Christena Deem MD  Primary Physician: Malcolm Metro M.D.  Reason for visit:  EGD  History of present illness:  Patient is a 46 year old female presenting today for EGD. She has personal history of Barrett's esophagus although on her last biopsy that was done on 05/06/2015 there was no Barrett's in evidence. She has been continuing on a proton pump inhibitor, pantoprazole. She has also changed some dietary issues and is currently on a vegan type diet.    Current Facility-Administered Medications:  .  0.9 %  sodium chloride infusion, , Intravenous, Continuous, Christena Deem, MD, Last Rate: 20 mL/hr at 02/08/17 1102 .  0.9 %  sodium chloride infusion, , Intravenous, Continuous, Christena Deem, MD  Prescriptions Prior to Admission  Medication Sig Dispense Refill Last Dose  . coconut oil OIL See admin instructions. One tablespoonful mixed with a beverage two times a day   02/07/2017 at Unknown time  . Flaxseed, Linseed, (FLAX SEED OIL PO) Take 1 capsule by mouth daily.    02/07/2017 at Unknown time  . levothyroxine (SYNTHROID, LEVOTHROID) 75 MCG tablet Take 75 mcg by mouth daily.   02/08/2017 at 0300  . pantoprazole (PROTONIX) 40 MG tablet Take 40 mg by mouth daily before breakfast.   02/07/2017 at Unknown time  . acetaminophen (TYLENOL) 500 MG tablet Take 500 mg by mouth every 6 (six) hours as needed (for cramps).    09/24/2016 at Unknown time  . fluocinonide gel (LIDEX) 0.05 % Apply 1 application topically daily as needed. FOR ECZEMA   Past Month at Unknown time  . ibuprofen (ADVIL,MOTRIN) 200 MG tablet Take 400-800 mg by mouth every 6 (six) hours as needed (for monthly cramps).    Not Taking at Unknown time  . meloxicam (MOBIC) 7.5 MG tablet Take 1 tablet by mouth daily with food. 30 tablet 1 02/05/2017  . ondansetron (ZOFRAN) 4 MG tablet Take 1 tablet (4 mg total) by mouth every 8 (eight) hours as needed for nausea or  vomiting. (Patient not taking: Reported on 02/08/2017) 20 tablet 0 Not Taking at Unknown time  . tetrahydrozoline 0.05 % ophthalmic solution Place 1-2 drops into both eyes 2 (two) times daily as needed (for irritation).    Not Taking at Unknown time  . traMADol (ULTRAM) 50 MG tablet Take 1 tablet (50 mg total) by mouth every 6 (six) hours as needed. (Patient not taking: Reported on 02/08/2017) 30 tablet 0 Not Taking at Unknown time     Allergies  Allergen Reactions  . Oxycodone Shortness Of Breath, Nausea And Vomiting and Other (See Comments)    Makes patient very lethargic also  . Codeine Nausea And Vomiting and Other (See Comments)    Patient states it makes her sick, violently nauseous, dizzy   . Ibuprofen Other (See Comments)    Stomach irritation  . Other Nausea And Vomiting    Pain meds (in general): Makes the patient very lethargic (also)  . Tape Rash    Patient already has eczema     Past Medical History:  Diagnosis Date  . Barrett's esophagus   . GERD (gastroesophageal reflux disease)   . Graves disease    In "remission"  . Graves disease   . PONV (postoperative nausea and vomiting)   . Sleep apnea     Review of systems:      Physical Exam    Heart and lungs: Regular rate and rhythm without rub or gallop, lungs  are bilaterally clear.    HEENT: Normocephalic atraumatic eyes are anicteric    Other:     Pertinant exam for procedure: Soft nontender nondistended bowel sounds positive normoactive.    Planned proceedures: EGD and indicated procedures. I have discussed the risks benefits and complications of procedures to include not limited to bleeding, infection, perforation and the risk of sedation and the patient wishes to proceed.    Christena Deem, MD Gastroenterology 02/08/2017  11:09 AM

## 2017-02-08 NOTE — Anesthesia Postprocedure Evaluation (Signed)
Anesthesia Post Note  Patient: AERABELLA FREELAND  Procedure(s) Performed: Procedure(s) (LRB): ESOPHAGOGASTRODUODENOSCOPY (EGD) WITH PROPOFOL (N/A)  Patient location during evaluation: Endoscopy Anesthesia Type: General Level of consciousness: awake and alert Pain management: pain level controlled Vital Signs Assessment: post-procedure vital signs reviewed and stable Respiratory status: spontaneous breathing, nonlabored ventilation, respiratory function stable and patient connected to nasal cannula oxygen Cardiovascular status: blood pressure returned to baseline and stable Postop Assessment: no signs of nausea or vomiting Anesthetic complications: no     Last Vitals:  Vitals:   02/08/17 1142 02/08/17 1152  BP: 107/76 118/88  Pulse: 69 74  Resp: 18 12  Temp: (!) 36.2 C   SpO2: 100% 100%    Last Pain:  Vitals:   02/08/17 1142  TempSrc: Tympanic                 Aleshia Cartelli S

## 2017-02-08 NOTE — Transfer of Care (Signed)
Immediate Anesthesia Transfer of Care Note  Patient: Elizabeth Garner  Procedure(s) Performed: Procedure(s): ESOPHAGOGASTRODUODENOSCOPY (EGD) WITH PROPOFOL (N/A)  Patient Location: PACU  Anesthesia Type:General  Level of Consciousness: awake and sedated  Airway & Oxygen Therapy: Patient Spontanous Breathing and Patient connected to nasal cannula oxygen  Post-op Assessment: Report given to RN and Post -op Vital signs reviewed and stable  Post vital signs: Reviewed and stable  Last Vitals:  Vitals:   02/08/17 1039  BP: 114/86  Pulse: 84  Resp: 20  Temp: 36.7 C  SpO2: 99%    Last Pain:  Vitals:   02/08/17 1039  TempSrc: Tympanic         Complications: No apparent anesthesia complications

## 2017-02-08 NOTE — Anesthesia Post-op Follow-up Note (Signed)
Anesthesia QCDR form completed.        

## 2017-02-08 NOTE — Anesthesia Preprocedure Evaluation (Signed)
Anesthesia Evaluation  Patient identified by MRN, date of birth, ID band Patient awake    Reviewed: Allergy & Precautions, NPO status , Patient's Chart, lab work & pertinent test results, reviewed documented beta blocker date and time   History of Anesthesia Complications (+) PONV and history of anesthetic complications  Airway Mallampati: III  TM Distance: >3 FB     Dental  (+) Chipped   Pulmonary sleep apnea ,           Cardiovascular      Neuro/Psych    GI/Hepatic GERD  ,  Endo/Other  Hyperthyroidism   Renal/GU      Musculoskeletal   Abdominal   Peds  Hematology   Anesthesia Other Findings   Reproductive/Obstetrics                             Anesthesia Physical Anesthesia Plan  ASA: III  Anesthesia Plan: General   Post-op Pain Management:    Induction: Intravenous  PONV Risk Score and Plan:   Airway Management Planned:   Additional Equipment:   Intra-op Plan:   Post-operative Plan:   Informed Consent: I have reviewed the patients History and Physical, chart, labs and discussed the procedure including the risks, benefits and alternatives for the proposed anesthesia with the patient or authorized representative who has indicated his/her understanding and acceptance.     Plan Discussed with: CRNA  Anesthesia Plan Comments:         Anesthesia Quick Evaluation

## 2017-02-08 NOTE — Op Note (Signed)
[facility] Patient Name: Elizabeth Garner Procedure Date: 02/08/2017 11:13 AM MRN: [id number] Account #: [id number] Date of Birth: [birth year] Admit Type: Outpatient Age: 46 Room: [facility] ENDO ROOM 3 Gender: Female Note Status: Finalized Procedure:            Upper GI endoscopy Indications:          Surveillance procedure, Follow-up of Barrett's esophagus Providers:            Christena Deem, MD Referring MD:         Durward Fortes. Hamrick (Referring MD) Medicines:            Monitored Anesthesia Care Complications:        No immediate complications. Procedure:            Pre-Anesthesia Assessment:                       - ASA Grade Assessment: III - A patient with severe                        systemic disease.                       After obtaining informed consent, the endoscope was                        passed under direct vision. Throughout the procedure,                        the patient's blood pressure, pulse, and oxygen                        saturations were monitored continuously. The Endoscope                        was introduced through the mouth, and advanced to the                        third part of duodenum. The upper GI endoscopy was                        accomplished without difficulty. The patient tolerated                        the procedure well. Findings:      The Z-line was irregular.      There were esophageal mucosal changes suggestive of short-segment       Barrett's esophagus present at the gastroesophageal junction. The       maximum longitudinal extent of these mucosal changes was 2 cm in length.       Mucosa was biopsied with a cold forceps for histology in 4 quadrants at       intervals of 1 cm at 34 and 35 cm from the incisors. A total of 2       specimen bottles were sent to pathology.      The exam of the esophagus was otherwise normal.      Diffuse minimal inflammation characterized by erythema was found in the       gastric body. Biopsies were taken with a cold forceps for histology.      A few 1 to 4 mm sessile polyps  with no bleeding and no stigmata of       recent bleeding were found in the gastric body. Biopsies were taken with       a cold forceps for histology.      The exam of the stomach was otherwise normal.      The examined duodenum was normal. Impression:           - Z-line irregular.                       - Esophageal mucosal changes suggestive of                        short-segment Barrett's esophagus. Biopsied.                       - Gastritis. Biopsied.                       - A few gastric polyps. Biopsied.                       - Normal examined duodenum. Recommendation:       - Discharge patient to home.                       - Continue present medications.                       - Await pathology results.                       - Telephone GI clinic for pathology results in 1 week. Procedure Code(s):    --- Professional ---                       [phone], Esophagogastroduodenoscopy, flexible, transoral;                        with biopsy, single or multiple Diagnosis Code(s):    --- Professional ---                       K22.8, Other specified diseases of esophagus                       K22.70, Barrett's esophagus without dysplasia                       K29.70, Gastritis, unspecified, without bleeding                       K31.7, Polyp of stomach and duodenum CPT copyright 2016 [employer]. All rights reserved. The codes documented in this report are preliminary and upon coder review may  be revised to meet current compliance requirements. Christena Deem, MD 02/08/2017 11:40:46 AM This report has been signed electronically. Number of Addenda: 0 Note Initiated On: 02/08/2017 11:13 AM      [facility]

## 2017-02-08 NOTE — Anesthesia Procedure Notes (Signed)
Performed by: COOK-MARTIN, Roan Sawchuk Pre-anesthesia Checklist: Patient identified, Emergency Drugs available, Suction available, Patient being monitored and Timeout performed Patient Re-evaluated:Patient Re-evaluated prior to induction Oxygen Delivery Method: Nasal cannula Preoxygenation: Pre-oxygenation with 100% oxygen Induction Type: IV induction Airway Equipment and Method: Bite block Placement Confirmation: positive ETCO2 and CO2 detector       

## 2017-02-11 ENCOUNTER — Encounter: Payer: Self-pay | Admitting: Gastroenterology

## 2017-02-12 LAB — SURGICAL PATHOLOGY

## 2017-07-04 IMAGING — MR MR HIP*L* W/CM
4 of 6 series · 19 of 40 positions shown · IV contrast (agent unspecified)
Comparison: Injection image same date.

CLINICAL DATA: Left hip and low back pain for several years, worse
over the last 3 years. No acute injury or prior relevant surgery.

EXAM:
MRI OF THE LEFT HIP WITH CONTRAST(MR Arthrogram)
TECHNIQUE: Multiplanar, multisequence MR imaging of the hip was performed
immediately following contrast injection into the hip joint under
fluoroscopic guidance. No intravenous contrast was administered.

[Series 3: STIR · coronal · 4.0mm · 0.74mm/px · 7 of 18 slices shown]
[im 1/18]
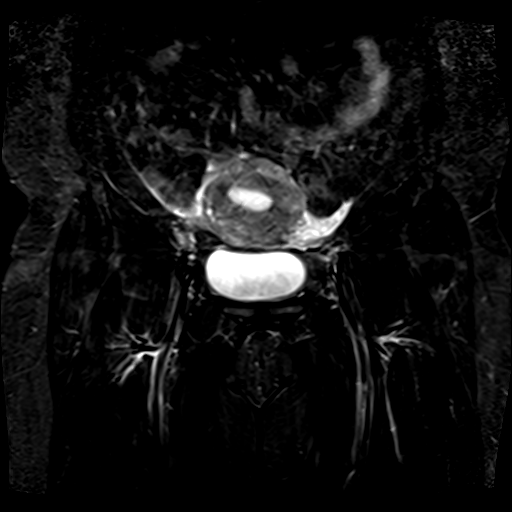
[im 3/18]
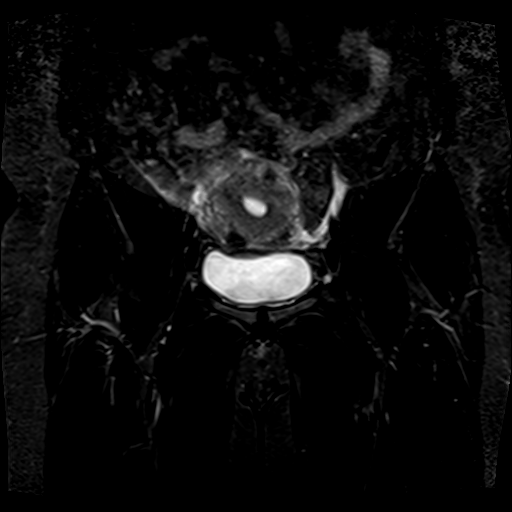
[im 6/18]
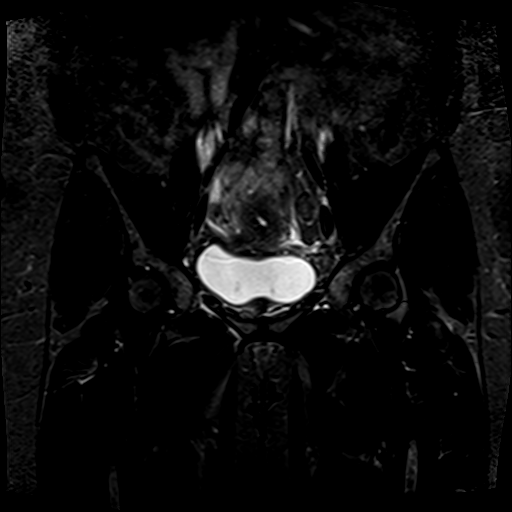
[im 9/18]
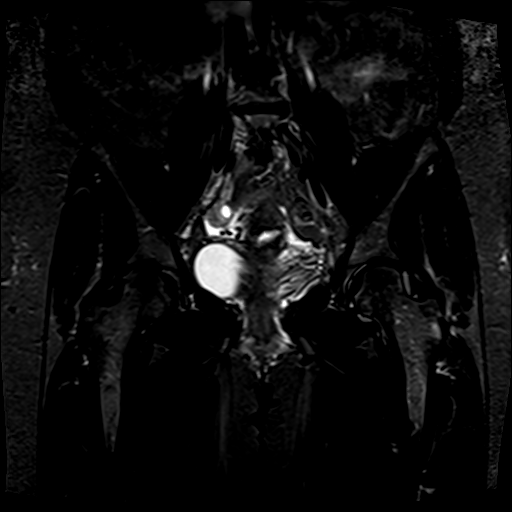
[im 12/18]
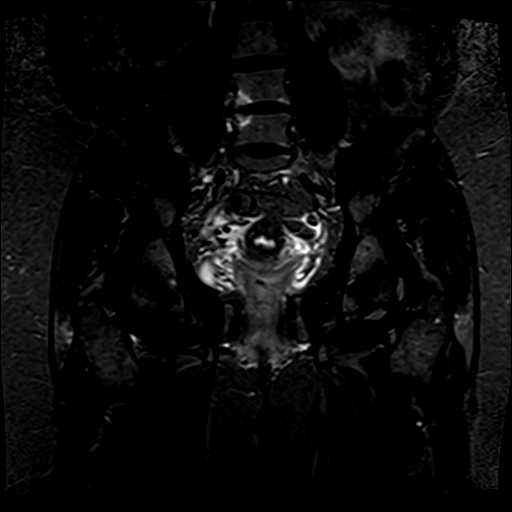
[im 15/18]
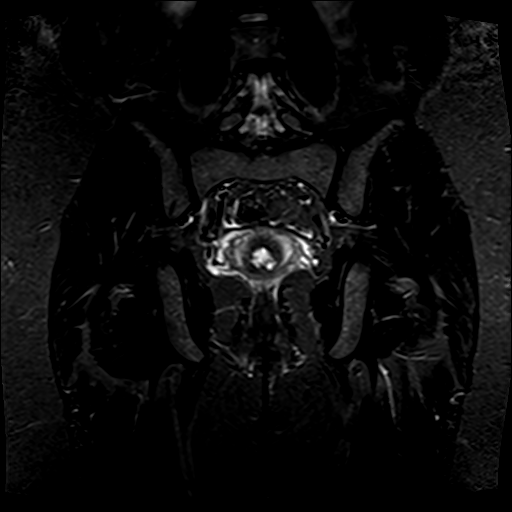
[im 18/18]
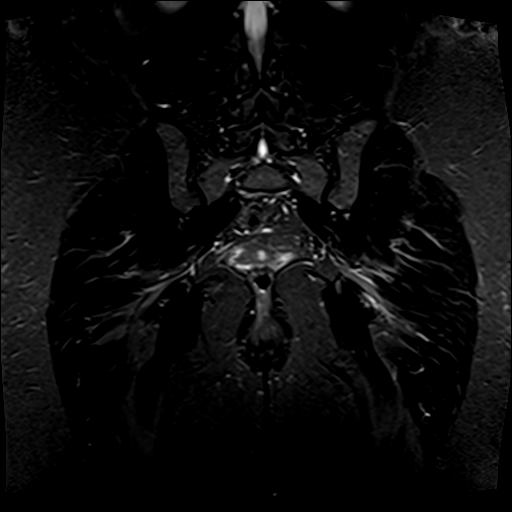

[Series 4: T2 · coronal · 4.0mm · 0.70mm/px · 6 of 18 slices shown]
[im 1/18]
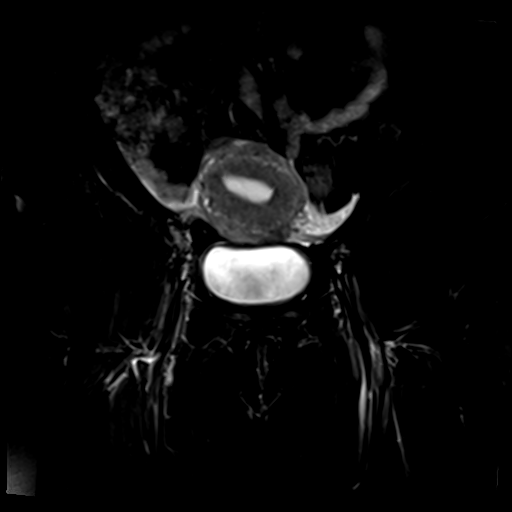
[im 3/18]
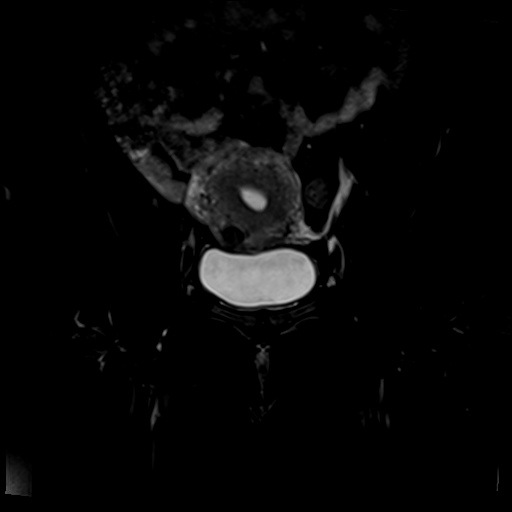
[im 6/18]
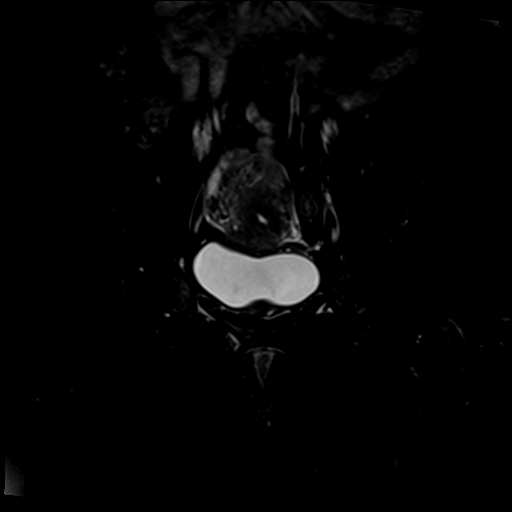
[im 9/18]
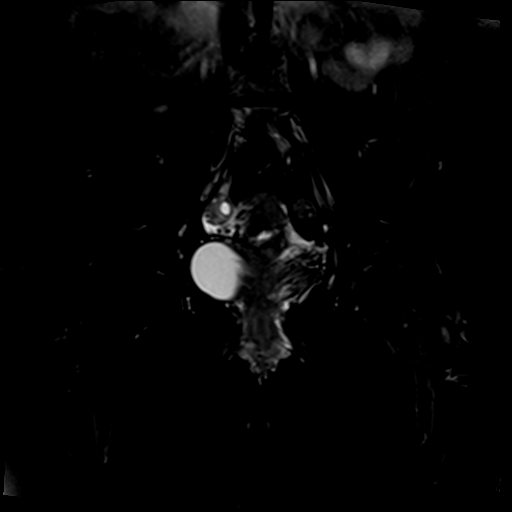
[im 12/18]
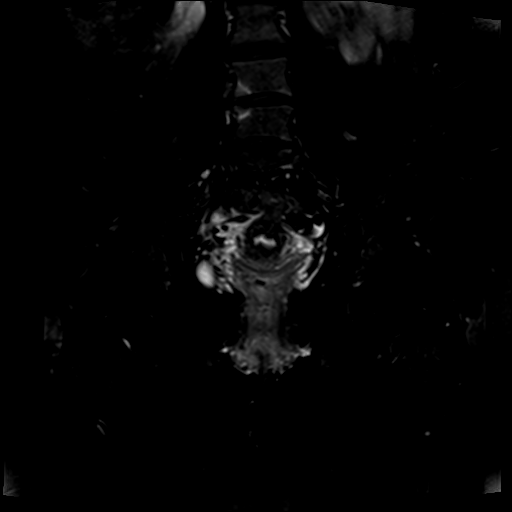
[im 15/18]
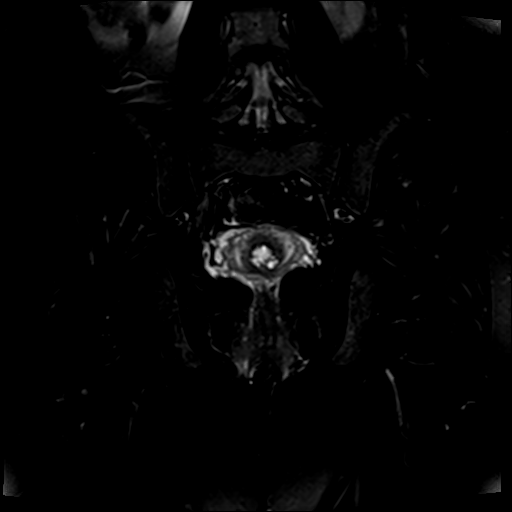

[Series 5: T1 · coronal · 4.0mm · 0.70mm/px · 3 of 18 slices shown (1 of 2)]
[im 4/18]
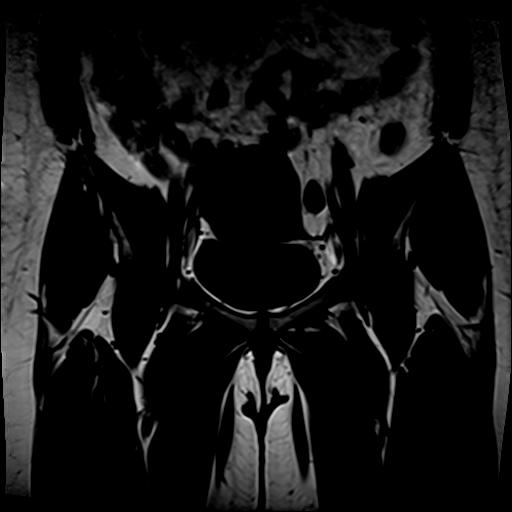
[im 11/18]
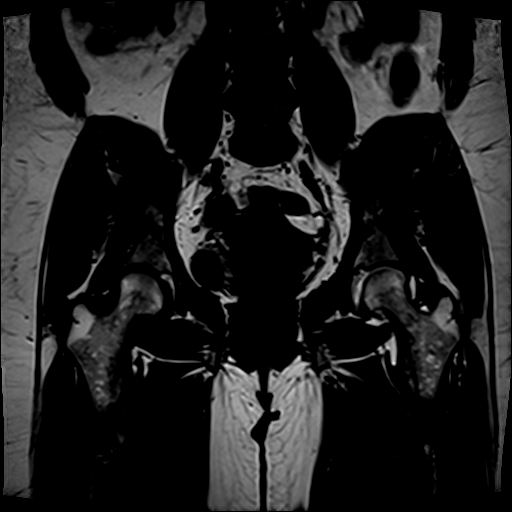
[im 18/18]
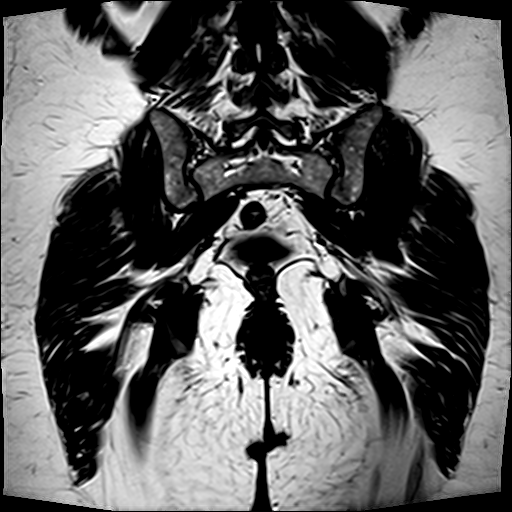

[Series 7: T1 · axial · 4.0mm · 0.35mm/px · z∈[-40,+8]mm · 3 of 20 slices shown (2 of 2)]
[im 4/20]
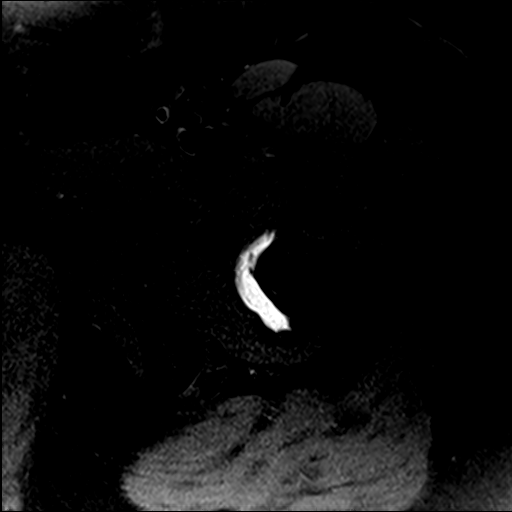
[im 10/20]
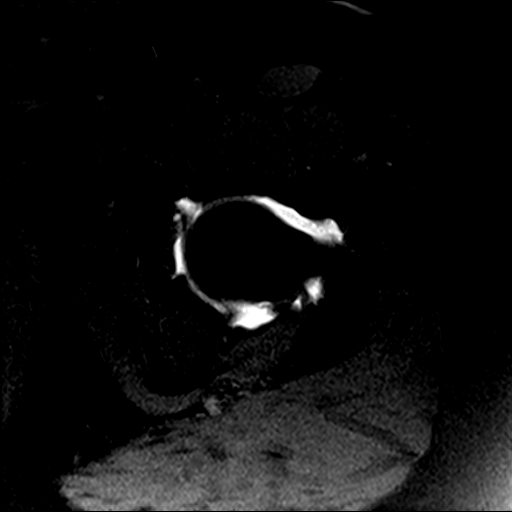
[im 16/20]
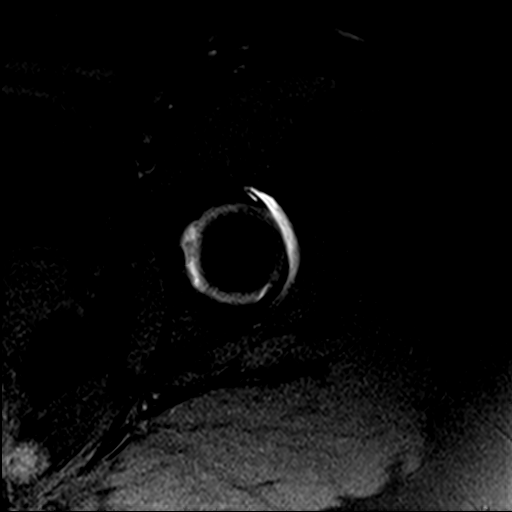

[19 of 40 positions shown; findings below may reference images not displayed]

FINDINGS: Bones: Both femoral heads appear normal without evidence of acute
fracture, dislocation or avascular necrosis. The visualized bony
pelvis appears normal. The sacroiliac joints and symphysis pubis
appear normal. Lower lumbar spondylosis noted with asymmetric
endplate degeneration on the right at L4-5.

Articular cartilage and labrum

Articular cartilage: No focal chondral defect or subchondral signal
abnormality identified.

Labrum: Mild anterior labral degeneration with possible small linear
tear, best seen on axial images 5 and 6. No paralabral cyst.

Joint or bursal effusion

Joint effusion: The joint is adequately distended with contrast. No
intra-articular loose bodies observed.

Bursae: No focal periarticular fluid collection.

Muscles and tendons

Muscles and tendons: Mild asymmetric gluteus tendinosis on the left.
No evidence of tendon tear. The iliopsoas and hamstring tendons
appear normal. The piriformis muscles are symmetric.

Other findings

Miscellaneous: Small uterine fibroids and cervical nabothian cysts
are noted. The visualized internal pelvic contents are otherwise
unremarkable.
IMPRESSION: 1. Mild asymmetric gluteus tendinosis on the left. No evidence of
tendon tear.
2. No significant arthropathic changes demonstrated at the left hip.
However, there is mild labral degeneration anteriorly with a
possible small nondisplaced tear.
3. Small uterine fibroids.

## 2018-05-09 ENCOUNTER — Encounter: Payer: Self-pay | Admitting: Emergency Medicine

## 2018-05-09 ENCOUNTER — Ambulatory Visit
Admission: EM | Admit: 2018-05-09 | Discharge: 2018-05-09 | Disposition: A | Discharge status: Home / Self Care | Payer: Worker's Compensation | Attending: Family Medicine | Admitting: Family Medicine

## 2018-05-09 ENCOUNTER — Ambulatory Visit (INDEPENDENT_AMBULATORY_CARE_PROVIDER_SITE_OTHER): Payer: Worker's Compensation

## 2018-05-09 ENCOUNTER — Other Ambulatory Visit: Payer: Self-pay

## 2018-05-09 DIAGNOSIS — W228XXA Striking against or struck by other objects, initial encounter: Secondary | ICD-10-CM | POA: Diagnosis not present

## 2018-05-09 DIAGNOSIS — S63642A Sprain of metacarpophalangeal joint of left thumb, initial encounter: Secondary | ICD-10-CM

## 2018-05-09 NOTE — ED Triage Notes (Signed)
Patient c/o left thumb pain while moving some pallets while at work.

## 2018-05-09 NOTE — Discharge Instructions (Addendum)
[facility] and wellness 1 week.  Wear splint for 1 week full-time until seen [facility] the [facility].  Phone #[phone]

## 2018-05-09 NOTE — ED Provider Notes (Signed)
[facility]    CSN: [id number] Arrival date & time: 05/09/18  1528     History   Chief Complaint Chief Complaint  Patient presents with  . Hand Pain    left thumb    HPI Elizabeth Garner is a 48 y.o. female.   HPI   This-47 year-old female at work today was sliding a  pallet off of a stack of pallets to the edge to  allow her to click down the other panels to the floor but instead fell rapidly hitting the floor and bouncing up pushing her left thumb into hyper abduction and hyperextension.  Maximal tenderness medial pain over the MP joint of her left nondominant thumb.         Past Medical History:  Diagnosis Date  . Barrett's esophagus   . GERD (gastroesophageal reflux disease)   . Graves disease    In "remission"  . Graves disease   . PONV (postoperative nausea and vomiting)   . Sleep apnea     Patient Active Problem List   Diagnosis Date Noted  . Abnormal uterine bleeding 01/04/2012    Past Surgical History:  Procedure Laterality Date  . APPENDECTOMY    . CESAREAN SECTION    . CESAREAN SECTION    . CLOSED REDUCTION FINGER WITH PERCUTANEOUS PINNING Right 09/25/2016   Procedure: CLOSED PINNING OF RIGHT INDEX AND LONG FINGERS PHALANGES FRACTURES;  Surgeon: Mack Hook, MD;  Location: [facility];  Service: Orthopedics;  Laterality: Right;  . COLONOSCOPY    . ESOPHAGOGASTRODUODENOSCOPY    . ESOPHAGOGASTRODUODENOSCOPY (EGD) WITH PROPOFOL N/A 05/06/2015   Procedure: ESOPHAGOGASTRODUODENOSCOPY (EGD) WITH PROPOFOL;  Surgeon: Christena Deem, MD;  Location: [facility] ENDOSCOPY;  Service: Endoscopy;  Laterality: N/A;  . ESOPHAGOGASTRODUODENOSCOPY (EGD) WITH PROPOFOL N/A 02/08/2017   Procedure: ESOPHAGOGASTRODUODENOSCOPY (EGD) WITH PROPOFOL;  Surgeon: Christena Deem, MD;  Location: [facility] ENDOSCOPY;  Service: Endoscopy;  Laterality: N/A;  . repair and excision eyl\elid    . SUTURE REMOVAL Right 09/25/2016   Procedure: SUTURE REMOVAL AND  CLOSURE RIGHT HAND LACERATION;  Surgeon: Mack Hook, MD;  Location: [facility];  Service: Orthopedics;  Laterality: Right;    OB History   None      Home Medications    Prior to Admission medications   Medication Sig Start Date End Date Taking? Authorizing Provider  levothyroxine (SYNTHROID, LEVOTHROID) 75 MCG tablet Take 75 mcg by mouth daily. 09/14/16  Yes [provider]  pantoprazole (PROTONIX) 40 MG tablet Take 40 mg by mouth daily before breakfast. 08/23/14  Yes [provider]  phentermine (ADIPEX-P) 37.5 MG tablet phentermine 37.5 mg tablet  TAKE 1 TABLET BY MOUTH EVERY DAY IN THE MORNING 02/12/18  Yes [provider]  acetaminophen (TYLENOL) 500 MG tablet Take 500 mg by mouth every 6 (six) hours as needed (for cramps).     [provider]  coconut oil OIL See admin instructions. One tablespoonful mixed with a beverage two times a day    [provider]  Flaxseed, Linseed, (FLAX SEED OIL PO) Take 1 capsule by mouth daily.     [provider]  fluocinonide gel (LIDEX) 0.05 % Apply 1 application topically daily as needed. FOR ECZEMA    [provider]  ibuprofen (ADVIL,MOTRIN) 200 MG tablet Take 400-800 mg by mouth every 6 (six) hours as needed (for monthly cramps).     [provider]  ondansetron (ZOFRAN) 4 MG tablet Take 1 tablet (4  mg total) by mouth every 8 (eight) hours as needed for nausea or vomiting. Patient not taking: Reported on 02/08/2017 09/25/16   Mack Hookhompson, David, MD  tetrahydrozoline 0.05 % ophthalmic solution Place 1-2 drops into both eyes 2 (two) times daily as needed (for irritation).     [provider]    Family History Family History  Problem Relation Age of Onset  . Cancer Mother     Social History Social History   Tobacco Use  . Smoking status: Never Smoker  . Smokeless tobacco: Never Used  Substance Use Topics  . Alcohol use: Yes    Alcohol/week: 2.0  standard drinks    Types: 2 Glasses of wine per week  . Drug use: No     Allergies   Oxycodone; Codeine; Ibuprofen; Other; and Tape   Review of Systems Review of Systems  Constitutional: Positive for activity change. Negative for appetite change, chills, fatigue and fever.  Musculoskeletal: Positive for arthralgias and myalgias.  All other systems reviewed and are negative.    Physical Exam Triage Vital Signs ED Triage Vitals  Enc Vitals Group     BP 05/09/18 1557 124/73     Pulse Rate 05/09/18 1557 93     Resp 05/09/18 1557 16     Temp 05/09/18 1557 97.8 F (36.6 C)     Temp Source 05/09/18 1557 Oral     SpO2 05/09/18 1557 100 %     Weight 05/09/18 1556 190 lb (86.2 kg)     Height 05/09/18 1556 5' (1.524 m)     Head Circumference --      Peak Flow --      Pain Score 05/09/18 1556 4     Pain Loc --      Pain Edu? --      Excl. in GC? --    No data found.  Updated Vital Signs BP 124/73 (BP Location: Left Arm)   Pulse 93   Temp 97.8 F (36.6 C) (Oral)   Resp 16   Ht 5' (1.524 m)   Wt 190 lb (86.2 kg)   LMP 04/22/2018 (Approximate)   SpO2 100%   BMI 37.11 kg/m   Visual Acuity Right Eye Distance:   Left Eye Distance:   Bilateral Distance:    Right Eye Near:   Left Eye Near:    Bilateral Near:     Physical Exam  Constitutional: She is oriented to person, place, and time. She appears well-developed and well-nourished. No distress.  HENT:  Head: Normocephalic.  Eyes: Pupils are equal, round, and reactive to light.  Neck: Normal range of motion.  Musculoskeletal: She exhibits edema and tenderness.  Exam of the left thumb shows all ecchymosis over the ulnar aspect of the MP joint.  The distal phalanx proximal phalanx and IP joint are nontender.  She has no tenderness over the MP joint dorsal and volarly and also over the  radial collateral ligament.  Tenderness over the ulnar collateral ligament at the MP joint.  Stressing shows a laxity in comparison to  the right.  She has tenderness and pain with valgus stressing of the MP joint.  Neurological: She is alert and oriented to person, place, and time.  Skin: Skin is warm. She is not diaphoretic.  Psychiatric: She has a normal mood and affect. Her behavior is normal. Judgment and thought content normal.  Nursing note and vitals reviewed.    UC Treatments / Results  Labs (all labs ordered are listed, but  only abnormal results are displayed) Labs Reviewed - No data to display  EKG None  Radiology Dg Finger Thumb Left  Result Date: 05/09/2018 CLINICAL DATA:  Left thumb pain after a fall. EXAM: LEFT THUMB 2+V COMPARISON:  None. FINDINGS: There is no evidence of fracture or dislocation. There is no evidence of arthropathy or other focal bone abnormality. Soft tissues are unremarkable IMPRESSION: Negative. Electronically Signed   By: Francene Boyers M.D.   On: 05/09/2018 16:35    Procedures Procedures (including critical care time)  Medications Ordered in UC Medications - No data to display  Initial Impression / Assessment and Plan / UC Course  I have reviewed the triage vital signs and the nursing notes.  Pertinent labs & imaging results that were available during my care of the patient were reviewed by me and considered in my medical decision making (see chart for details). Viewed the x-rays with the patient.  No fractures or dislocations are identified.  Told her that suspect a laxity of the ulnar collateral ligament of the MP joint of the left thumb.  Place her into a thumb spica keep on for 1 week until reevaluated by [facility] employee health and wellness.  Call to make up that appointment.  Work for [employer]. was then completed.  May work but will be restricted by the splint use on her left hand.  She may take Tylenol but would be better served by taking an nonsteroidal anti-inflammatory drug.  She should take this with food.     Final Clinical Impressions(s) / UC Diagnoses    Final diagnoses:  Sprain of ulnar collateral ligament of metacarpophalangeal (MCP) joint of left thumb, initial encounter     Discharge Instructions     Follow-up at [facility] employee health and wellness 1 week.  Wear splint for 1 week full-time until seen at the [facility] occupational health.  Phone #[phone]    ED Prescriptions    None     Controlled Substance Prescriptions [place] Controlled Substance Registry consulted? Not Applicable   Lutricia Feil, PA-C 05/09/18 1729

## 2019-06-18 ENCOUNTER — Other Ambulatory Visit: Payer: Self-pay

## 2019-06-18 ENCOUNTER — Emergency Department
Admission: EM | Admit: 2019-06-18 | Discharge: 2019-06-18 | Disposition: A | Discharge status: Home / Self Care | Payer: BC Managed Care – PPO | Attending: Emergency Medicine | Admitting: Emergency Medicine

## 2019-06-18 DIAGNOSIS — L500 Allergic urticaria: Secondary | ICD-10-CM | POA: Diagnosis not present

## 2019-06-18 DIAGNOSIS — Z79899 Other long term (current) drug therapy: Secondary | ICD-10-CM | POA: Insufficient documentation

## 2019-06-18 DIAGNOSIS — L509 Urticaria, unspecified: Secondary | ICD-10-CM

## 2019-06-18 DIAGNOSIS — R21 Rash and other nonspecific skin eruption: Secondary | ICD-10-CM | POA: Diagnosis present

## 2019-06-18 DIAGNOSIS — T7840XA Allergy, unspecified, initial encounter: Secondary | ICD-10-CM

## 2019-06-18 LAB — CBC WITH DIFFERENTIAL/PLATELET
Abs Immature Granulocytes: 0.02 10*3/uL (ref 0.00–0.07)
Basophils Absolute: 0 10*3/uL (ref 0.0–0.1)
Basophils Relative: 1 %
Eosinophils Absolute: 0.1 10*3/uL (ref 0.0–0.5)
Eosinophils Relative: 1 %
HCT: 39.2 % (ref 36.0–46.0)
Hemoglobin: 12.9 g/dL (ref 12.0–15.0)
Immature Granulocytes: 0 %
Lymphocytes Relative: 38 %
Lymphs Abs: 2 10*3/uL (ref 0.7–4.0)
MCH: 29.3 pg (ref 26.0–34.0)
MCHC: 32.9 g/dL (ref 30.0–36.0)
MCV: 89.1 fL (ref 80.0–100.0)
Monocytes Absolute: 0.3 10*3/uL (ref 0.1–1.0)
Monocytes Relative: 6 %
Neutro Abs: 2.9 10*3/uL (ref 1.7–7.7)
Neutrophils Relative %: 54 %
Platelets: 263 10*3/uL (ref 150–400)
RBC: 4.4 MIL/uL (ref 3.87–5.11)
RDW: 12.9 % (ref 11.5–15.5)
WBC: 5.3 10*3/uL (ref 4.0–10.5)
nRBC: 0 % (ref 0.0–0.2)

## 2019-06-18 LAB — BASIC METABOLIC PANEL
Anion gap: 7 (ref 5–15)
BUN: 15 mg/dL (ref 6–20)
CO2: 28 mmol/L (ref 22–32)
Calcium: 8.6 mg/dL — ABNORMAL LOW (ref 8.9–10.3)
Chloride: 103 mmol/L (ref 98–111)
Creatinine, Ser: 0.78 mg/dL (ref 0.44–1.00)
GFR calc Af Amer: >60 mL/min (ref >60)
GFR calc non Af Amer: >60 mL/min (ref >60)
Glucose, Bld: 112 mg/dL — ABNORMAL HIGH (ref 70–99)
Potassium: 3.9 mmol/L (ref 3.5–5.1)
Sodium: 138 mmol/L (ref 135–145)

## 2019-06-18 LAB — SEDIMENTATION RATE: Sed Rate: 7 mm/hr (ref 0–20)

## 2019-06-18 MED ORDER — PREDNISONE 20 MG PO TABS: ORAL_TABLET | ORAL | 0 refills | Status: AC

## 2019-06-18 MED ORDER — DIPHENHYDRAMINE HCL 50 MG/ML IJ SOLN
INTRAMUSCULAR | Status: AC
  Administered 2019-06-18: 03:00:00 25 mg via INTRAVENOUS
  Filled 2019-06-18: qty 1

## 2019-06-18 MED ORDER — EPINEPHRINE 0.3 MG/0.3ML IJ SOAJ
0.3000 mg | Freq: Once | INTRAMUSCULAR | Status: AC
  Administered 2019-06-18: 03:00:00 0.3 mg via INTRAMUSCULAR

## 2019-06-18 MED ORDER — METHYLPREDNISOLONE SODIUM SUCC 125 MG IJ SOLR: 125.0000 mg | Freq: Once | INTRAMUSCULAR | Status: AC

## 2019-06-18 MED ORDER — SODIUM CHLORIDE 0.9 % IV BOLUS
1000.0000 mL | Freq: Once | INTRAVENOUS | Status: AC
  Administered 2019-06-18: 04:00:00 1000 mL via INTRAVENOUS

## 2019-06-18 MED ORDER — DIPHENHYDRAMINE HCL 50 MG/ML IJ SOLN: 25.0000 mg | Freq: Once | INTRAMUSCULAR | Status: AC

## 2019-06-18 MED ORDER — EPINEPHRINE 0.3 MG/0.3ML IJ SOAJ
INTRAMUSCULAR | Status: AC
  Filled 2019-06-18: qty 0.3

## 2019-06-18 MED ORDER — METHYLPREDNISOLONE SODIUM SUCC 125 MG IJ SOLR
INTRAMUSCULAR | Status: AC
  Administered 2019-06-18: 03:00:00 125 mg via INTRAVENOUS
  Filled 2019-06-18: qty 2

## 2019-06-18 MED ORDER — FAMOTIDINE 20 MG PO TABS: 20.0000 mg | ORAL_TABLET | Freq: Two times a day (BID) | ORAL | 0 refills | Status: AC

## 2019-06-18 MED ORDER — FAMOTIDINE IN NACL 20-0.9 MG/50ML-% IV SOLN
20.0000 mg | Freq: Once | INTRAVENOUS | Status: AC
  Administered 2019-06-18: 04:00:00 20 mg via INTRAVENOUS
  Filled 2019-06-18: qty 50

## 2019-06-18 MED ORDER — EPINEPHRINE 0.3 MG/0.3ML IJ SOAJ: 0.3000 mg | Freq: Once | INTRAMUSCULAR | 1 refills | Status: AC

## 2019-06-18 NOTE — ED Triage Notes (Signed)
Pt presents via POV c/o rash for a couple of days. Reports now throat is getting tight. Ambulatory to room. Beather Arbour MD at bedside.

## 2019-06-18 NOTE — ED Provider Notes (Signed)
[facility] Emergency Department Provider Note   ____________________________________________   First MD Initiated Contact with Patient 06/18/19 0240     (approximate)  I have reviewed the triage vital signs and the nursing notes.   HISTORY  Chief Complaint Rash    HPI Marita SnellenDebbie P Schwarzkopf is a 48 y.o. female who presents to the ED from home with a chief complaint of rash and feeling of throat constriction.  Patient reports she has a "seasonal rash" twice a year.  Diffuse rash x2 days; tonight feels like her throat is getting tight.  Denies new foods or exposures.  She was recently switched this week from pantoprazole to Dexilant and a nasal spray.  Has never seen an allergist.  Denies fever, chest pain, shortness of breath, abdominal pain, nausea or vomiting.  Took Benadryl over the course of the day without relief of symptoms.       Past Medical History:  Diagnosis Date  . Barrett's esophagus   . GERD (gastroesophageal reflux disease)   . Graves disease    In "remission"  . Graves disease   . PONV (postoperative nausea and vomiting)   . Sleep apnea     Patient Active Problem List   Diagnosis Date Noted  . Abnormal uterine bleeding 01/04/2012    Past Surgical History:  Procedure Laterality Date  . APPENDECTOMY    . CESAREAN SECTION    . CESAREAN SECTION    . CLOSED REDUCTION FINGER WITH PERCUTANEOUS PINNING Right 09/25/2016   Procedure: CLOSED PINNING OF RIGHT INDEX AND LONG FINGERS PHALANGES FRACTURES;  Surgeon: Mack Hookavid Thompson, MD;  Location: [facility];  Service: Orthopedics;  Laterality: Right;  . COLONOSCOPY    . ESOPHAGOGASTRODUODENOSCOPY    . ESOPHAGOGASTRODUODENOSCOPY (EGD) WITH PROPOFOL N/A 05/06/2015   Procedure: ESOPHAGOGASTRODUODENOSCOPY (EGD) WITH PROPOFOL;  Surgeon: Christena DeemMartin U Skulskie, MD;  [facility];  Service: Endoscopy;  Laterality: N/A;  . ESOPHAGOGASTRODUODENOSCOPY (EGD) WITH PROPOFOL N/A 02/08/2017   Procedure: ESOPHAGOGASTRODUODENOSCOPY (EGD) WITH PROPOFOL;  Surgeon: Christena DeemSkulskie, Martin U, MD;  [facility];  Service: Endoscopy;  Laterality: N/A;  . repair and excision eyl\elid    . SUTURE REMOVAL Right 09/25/2016   Procedure: SUTURE REMOVAL AND CLOSURE RIGHT HAND LACERATION;  Surgeon: Mack Hookavid Thompson, MD;  Location: [facility];  Service: Orthopedics;  Laterality: Right;    Prior to Admission medications   Medication Sig Start Date End Date Taking? Authorizing Provider  acetaminophen (TYLENOL) 500 MG tablet Take 500 mg by mouth every 6 (six) hours as needed (for cramps).     [provider]  coconut oil OIL See admin instructions. One tablespoonful mixed with a beverage two times a day    [provider]  EPINEPHrine 0.3 mg/0.3 mL IJ SOAJ injection Inject 0.3 mLs (0.3 mg total) into the muscle once for 1 dose. 06/18/19 06/18/19  Irean HongSung, Marisel Tostenson J, MD  famotidine (PEPCID) 20 MG tablet Take 1 tablet (20 mg total) by mouth 2 (two) times daily. 06/18/19   Irean HongSung, Gomer France J, MD  Flaxseed, Linseed, (FLAX SEED OIL PO) Take 1 capsule by mouth daily.     [provider]  fluocinonide gel (LIDEX) 0.05 % Apply 1 application topically daily as needed. FOR ECZEMA    [provider]  ibuprofen (ADVIL,MOTRIN) 200 MG tablet Take 400-800 mg by mouth every 6 (six) hours as needed (for monthly cramps).     [provider]  levothyroxine (SYNTHROID, LEVOTHROID) 75 MCG tablet Take 75 mcg  by mouth daily. 09/14/16   [provider]  ondansetron (ZOFRAN) 4 MG tablet Take 1 tablet (4 mg total) by mouth every 8 (eight) hours as needed for nausea or vomiting. Patient not taking: Reported on 02/08/2017 09/25/16   Milly Jakob, MD  pantoprazole (PROTONIX) 40 MG tablet Take 40 mg by mouth daily before breakfast. 08/23/14   [provider]  phentermine (ADIPEX-P) 37.5 MG tablet phentermine 37.5 mg tablet  TAKE 1 TABLET BY MOUTH EVERY DAY IN THE  MORNING 02/12/18   [provider]  predniSONE (DELTASONE) 20 MG tablet 3 tablets daily x 4 days 06/18/19   Paulette Blanch, MD  tetrahydrozoline 0.05 % ophthalmic solution Place 1-2 drops into both eyes 2 (two) times daily as needed (for irritation).     [provider]    Allergies Oxycodone, Codeine, Ibuprofen, Other, and Tape  Family History  Problem Relation Age of Onset  . Cancer Mother     Social History Social History   Tobacco Use  . Smoking status: Never Smoker  . Smokeless tobacco: Never Used  Substance Use Topics  . Alcohol use: Yes    Alcohol/week: 2.0 standard drinks    Types: 2 Glasses of wine per week  . Drug use: No    Review of Systems  Constitutional: No fever/chills Eyes: No visual changes. ENT: Positive for feeling of throat constriction.  No sore throat. Cardiovascular: Denies chest pain. Respiratory: Denies shortness of breath. Gastrointestinal: No abdominal pain.  No nausea, no vomiting.  No diarrhea.  No constipation. Genitourinary: Negative for dysuria. Musculoskeletal: Negative for back pain. Skin: Positive for rash. Neurological: Negative for headaches, focal weakness or numbness.   ____________________________________________   PHYSICAL EXAM:  VITAL SIGNS: ED Triage Vitals  Enc Vitals Group     BP      Pulse      Resp      Temp      Temp src      SpO2      Weight      Height      Head Circumference      Peak Flow      Pain Score      Pain Loc      Pain Edu?      Excl. in [place]?     Constitutional: Alert and oriented. Well appearing and in mild acute distress.  Anxious appearing. Eyes: Conjunctivae are normal. PERRL. EOMI. Head: Atraumatic. Nose: No congestion/rhinnorhea. Mouth/Throat: Mucous membranes are moist.  Oropharynx non-erythematous.  No facial, tongue or lip swelling.  Phonation normal.  There is no hoarse or muffled voice.  There is no drooling. Neck: No stridor.  No neck swelling. Cardiovascular:  Normal rate, regular rhythm. Grossly normal heart sounds.  Good peripheral circulation. Respiratory: Normal respiratory effort.  No retractions. Lungs CTAB. Gastrointestinal: Soft and nontender. No distention. No abdominal bruits. No CVA tenderness. Musculoskeletal: No lower extremity tenderness.  Mild bilateral pedal edema.  No joint effusions. Neurologic:  Normal speech and language. No gross focal neurologic deficits are appreciated. No gait instability. Skin:  Skin is warm, dry and intact.  Diffuse urticaria noted. Psychiatric: Mood and affect are normal. Speech and behavior are normal.  ____________________________________________   LABS (all labs ordered are listed, but only abnormal results are displayed)  Labs Reviewed  BASIC METABOLIC PANEL - Abnormal; Notable for the following components:      Result Value   Glucose, Bld 112 (*)    Calcium 8.6 (*)  All other components within normal limits  CBC WITH DIFFERENTIAL/PLATELET  SEDIMENTATION RATE   ____________________________________________  EKG  None ____________________________________________  RADIOLOGY  ED MD interpretation: None  Official radiology report(s): No results found.  ____________________________________________   PROCEDURES  Procedure(s) performed (including Critical Care):  Procedures   ____________________________________________   INITIAL IMPRESSION / ASSESSMENT AND PLAN / ED COURSE  As part of my medical decision making, I reviewed the following data within the electronic MEDICAL RECORD NUMBER Nursing notes reviewed and incorporated, Labs reviewed, Old chart reviewed and Notes from prior ED visits     LUNABELLA BADGETT was evaluated in Emergency Department on 06/18/2019 for the symptoms described in the history of present illness. She was evaluated in the context of the global COVID-19 pandemic, which necessitated consideration that the patient might be at risk for infection with the  SARS-CoV-2 virus that causes COVID-19. Institutional protocols and algorithms that pertain to the evaluation of patients at risk for COVID-19 are in a state of rapid change based on information released by regulatory bodies including the CDC and federal and state organizations. These policies and algorithms were followed during the patient's care in the ED.    48 year old female who presents with diffuse urticaria and feeling of throat constriction.  Will administer IV allergic reaction cocktail to include Benadryl, Solu-Medrol, Pepcid and fluids.  Will administer IM epi and observe in the ED for a minimum of 3 hours.  Lab work done at [employer] request.  Clinical Course as of Jun 17 620  Thu Jun 18, 2019  0550 Oral temp 98.3 F.  Hives have resolved.  There is no angioedema.  Phonation is normal.  Room air sats 100%.  Updated patient on lab results.  Encouraged follow-up with ENT for allergy testing.  Strict return precautions given.  Patient verbalizes understanding agrees with plan of care.   [JS]    Clinical Course User Index [JS] Irean Hong, MD     ____________________________________________   FINAL CLINICAL IMPRESSION(S) / ED DIAGNOSES  Final diagnoses:  Urticaria  Allergic reaction, initial encounter     ED Discharge Orders         Ordered    predniSONE (DELTASONE) 20 MG tablet     06/18/19 0539    famotidine (PEPCID) 20 MG tablet  2 times daily     06/18/19 0539    EPINEPHrine 0.3 mg/0.3 mL IJ SOAJ injection   Once     06/18/19 2993           Note:  This document was prepared using Dragon voice recognition software and may include unintentional dictation errors.   Irean Hong, MD 06/18/19 [phone]

## 2019-06-18 NOTE — ED Notes (Signed)
E-signature not working at this time. Pt verbalized understanding of D/C instructions, prescriptions and follow up care with no further questions at this time. Pt in NAD and ambulatory at time of D/C.  

## 2019-06-18 NOTE — Discharge Instructions (Signed)
1. Take the following medicines for the next 4 days: Prednisone 60mg daily Pepcid 20mg twice daily 2. Take Benadryl as needed for itching. 3. Use Epi-Pen in case of acute, life-threatening allergic reaction. 4. Return to the ER for worsening symptoms, persistent vomiting, difficulty breathing or other concerns.  

## 2019-06-19 ENCOUNTER — Emergency Department
Admission: EM | Admit: 2019-06-19 | Discharge: 2019-06-19 | Disposition: A | Discharge status: Home / Self Care | Payer: BC Managed Care – PPO | Attending: Emergency Medicine | Admitting: Emergency Medicine

## 2019-06-19 ENCOUNTER — Encounter: Payer: Self-pay | Admitting: Emergency Medicine

## 2019-06-19 ENCOUNTER — Other Ambulatory Visit: Payer: Self-pay

## 2019-06-19 ENCOUNTER — Emergency Department: Payer: BC Managed Care – PPO

## 2019-06-19 DIAGNOSIS — Z79899 Other long term (current) drug therapy: Secondary | ICD-10-CM | POA: Diagnosis not present

## 2019-06-19 DIAGNOSIS — K2970 Gastritis, unspecified, without bleeding: Secondary | ICD-10-CM | POA: Diagnosis not present

## 2019-06-19 DIAGNOSIS — R079 Chest pain, unspecified: Secondary | ICD-10-CM | POA: Diagnosis present

## 2019-06-19 LAB — CBC
HCT: 39 % (ref 36.0–46.0)
Hemoglobin: 12.7 g/dL (ref 12.0–15.0)
MCH: 29.3 pg (ref 26.0–34.0)
MCHC: 32.6 g/dL (ref 30.0–36.0)
MCV: 90.1 fL (ref 80.0–100.0)
Platelets: 323 10*3/uL (ref 150–400)
RBC: 4.33 MIL/uL (ref 3.87–5.11)
RDW: 13 % (ref 11.5–15.5)
WBC: 19.3 10*3/uL — ABNORMAL HIGH (ref 4.0–10.5)
nRBC: 0 % (ref 0.0–0.2)

## 2019-06-19 LAB — BASIC METABOLIC PANEL
Anion gap: 9 (ref 5–15)
BUN: 11 mg/dL (ref 6–20)
CO2: 24 mmol/L (ref 22–32)
Calcium: 8.9 mg/dL (ref 8.9–10.3)
Chloride: 104 mmol/L (ref 98–111)
Creatinine, Ser: 0.71 mg/dL (ref 0.44–1.00)
GFR calc Af Amer: >60 mL/min (ref >60)
GFR calc non Af Amer: >60 mL/min (ref >60)
Glucose, Bld: 143 mg/dL — ABNORMAL HIGH (ref 70–99)
Potassium: 4.2 mmol/L (ref 3.5–5.1)
Sodium: 137 mmol/L (ref 135–145)

## 2019-06-19 LAB — TROPONIN I (HIGH SENSITIVITY)
Troponin I (High Sensitivity): 5 ng/L (ref <18)
Troponin I (High Sensitivity): 5 ng/L (ref <18)

## 2019-06-19 LAB — URINALYSIS, COMPLETE (UACMP) WITH MICROSCOPIC
Bilirubin Urine: NEGATIVE
Glucose, UA: NEGATIVE mg/dL
Hgb urine dipstick: NEGATIVE
Ketones, ur: NEGATIVE mg/dL
Leukocytes,Ua: NEGATIVE
Nitrite: NEGATIVE
Protein, ur: NEGATIVE mg/dL
Specific Gravity, Urine: 1.003 — ABNORMAL LOW (ref 1.005–1.030)
pH: 7 (ref 5.0–8.0)

## 2019-06-19 MED ORDER — SODIUM CHLORIDE 0.9% FLUSH: 3.0000 mL | Freq: Once | INTRAVENOUS | Status: DC

## 2019-06-19 MED ORDER — LIDOCAINE VISCOUS HCL 2 % MT SOLN: 15.0000 mL | OROMUCOSAL | 0 refills | Status: DC | PRN

## 2019-06-19 MED ORDER — LIDOCAINE VISCOUS HCL 2 % MT SOLN
15.0000 mL | Freq: Once | OROMUCOSAL | Status: AC
  Administered 2019-06-19: 15 mL via OROMUCOSAL
  Filled 2019-06-19: qty 15

## 2019-06-19 MED ORDER — LIDOCAINE VISCOUS HCL 2 % MT SOLN: 15.0000 mL | OROMUCOSAL | 0 refills | Status: AC | PRN

## 2019-06-19 NOTE — Discharge Instructions (Addendum)
Please seek medical attention for any high fevers, chest pain, shortness of breath, change in behavior, persistent vomiting, bloody stool or any other new or concerning symptoms.  

## 2019-06-19 NOTE — ED Provider Notes (Signed)
[facility] Emergency Department Provider Note   ____________________________________________   I have reviewed the triage vital signs and the nursing notes.   HISTORY  Chief Complaint Chest Pain   History limited by: Not Limited   HPI Elizabeth Garner is a 48 y.o. female who presents to the emergency department today because of concern for chest pressure. She states that it started early this morning around 130am. She tried being slightly active after the pain started to see if it would go away. It did not however significantly change. The patient felt like she had some shortness of breath with the chest pressure but did not feel like the pain was worse when she took deep breaths. The patient denies any associated nausea or vomiting. The patient was seen in the ED yesterday for a rash and was given steroids. This is her first time getting steroids and she says she is very sensitive to medication. Denies any fevers. Does feel like her rash is slightly better.   Records reviewed. Per medical record review patient has a history of being seen in the emergency department last night for rash. Did receive 125mg  methylprednisolone.   Past Medical History:  Diagnosis Date  . Barrett's esophagus   . GERD (gastroesophageal reflux disease)   . Graves disease    In "remission"  . Graves disease   . PONV (postoperative nausea and vomiting)   . Sleep apnea     Patient Active Problem List   Diagnosis Date Noted  . Abnormal uterine bleeding 01/04/2012    Past Surgical History:  Procedure Laterality Date  . APPENDECTOMY    . CESAREAN SECTION    . CESAREAN SECTION    . CLOSED REDUCTION FINGER WITH PERCUTANEOUS PINNING Right 09/25/2016   Procedure: CLOSED PINNING OF RIGHT INDEX AND LONG FINGERS PHALANGES FRACTURES;  Surgeon: Milly Jakob, MD;  Location: [place];  Service: Orthopedics;  Laterality: Right;  . COLONOSCOPY    . ESOPHAGOGASTRODUODENOSCOPY     . ESOPHAGOGASTRODUODENOSCOPY (EGD) WITH PROPOFOL N/A 05/06/2015   Procedure: ESOPHAGOGASTRODUODENOSCOPY (EGD) WITH PROPOFOL;  Surgeon: Lollie Sails, MD;  Location: [facility] END[facility]COPY;  Service: Endoscopy;  Laterality: N/A;  . ESOPHAGOGASTRODUODENOSCOPY (EGD) WITH PROPOFOL N/A 02/08/2017   Procedure: ESOPHAGOGASTRODUODENOSCOPY (EGD) WITH PROPOFOL;  Surgeon: Lollie Sails, MD;  Location: [facility] ENDOSCOPY;  Service: Endoscopy;  Laterality: N/A;  . repair and excision eyl\elid    . SUTURE REMOVAL Right 09/25/2016   Procedure: SUTURE REMOVAL AND CLOSURE RIGHT HAND LACERATION;  Surgeon: Milly Jakob, MD;  Location: [place];  Service: Orthopedics;  Laterality: Right;    Prior to Admission medications   Medication Sig Start Date End Date Taking? Authorizing Provider  acetaminophen (TYLENOL) 500 MG tablet Take 500 mg by mouth every 6 (six) hours as needed (for cramps).     [provider]  coconut oil OIL See admin instructions. One tablespoonful mixed with a beverage two times a day    [provider]  famotidine (PEPCID) 20 MG tablet Take 1 tablet (20 mg total) by mouth 2 (two) times daily. 06/18/19   Paulette Blanch, MD  Flaxseed, Linseed, (FLAX SEED OIL PO) Take 1 capsule by mouth daily.     [provider]  fluocinonide gel (LIDEX) 6.26 % Apply 1 application topically daily as needed. FOR ECZEMA    [provider]  ibuprofen (ADVIL,MOTRIN) 200 MG tablet Take 400-800 mg by mouth every 6 (six) hours as needed (for monthly cramps).  [provider]  levothyroxine (SYNTHROID, LEVOTHROID) 75 MCG tablet Take 75 mcg by mouth daily. 09/14/16   [provider]  ondansetron (ZOFRAN) 4 MG tablet Take 1 tablet (4 mg total) by mouth every 8 (eight) hours as needed for nausea or vomiting. Patient not taking: Reported on 02/08/2017 09/25/16   Mack Hook, MD  pantoprazole (PROTONIX) 40 MG tablet Take 40 mg by mouth daily before  breakfast. 08/23/14   [provider]  phentermine (ADIPEX-P) 37.5 MG tablet phentermine 37.5 mg tablet  TAKE 1 TABLET BY MOUTH EVERY DAY IN THE MORNING 02/12/18   [provider]  predniSONE (DELTASONE) 20 MG tablet 3 tablets daily x 4 days 06/18/19   Irean Hong, MD  tetrahydrozoline 0.05 % ophthalmic solution Place 1-2 drops into both eyes 2 (two) times daily as needed (for irritation).     [provider]    Allergies Oxycodone, Codeine, Ibuprofen, Other, and Tape  Family History  Problem Relation Age of Onset  . Cancer Mother     Social History Social History   Tobacco Use  . Smoking status: Never Smoker  . Smokeless tobacco: Never Used  Substance Use Topics  . Alcohol use: Yes    Alcohol/week: 2.0 standard drinks    Types: 2 Glasses of wine per week  . Drug use: No    Review of Systems Constitutional: No fever/chills Eyes: No visual changes. ENT: No sore throat. Cardiovascular: Positive chest pain. Respiratory: Positive shortness of breath. Gastrointestinal: No abdominal pain.  No nausea, no vomiting.  No diarrhea.   Genitourinary: Negative for dysuria. Musculoskeletal: Negative for back pain. Skin: Negative for rash. Neurological: Negative for headaches, focal weakness or numbness.  ____________________________________________   PHYSICAL EXAM:  VITAL SIGNS: ED Triage Vitals  Enc Vitals Group     BP 06/19/19 0839 135/77     Pulse Rate 06/19/19 0839 95     Resp 06/19/19 0839 18     Temp 06/19/19 0839 98.5 F (36.9 C)     Temp Source 06/19/19 0839 Oral     SpO2 06/19/19 0839 99 %     Weight 06/19/19 0838 185 lb (83.9 kg)     Height 06/19/19 0838 5' (1.524 m)   Constitutional: Alert and oriented.  Eyes: Conjunctivae are normal.  ENT      Head: Normocephalic and atraumatic.      Nose: No congestion/rhinnorhea.      Mouth/Throat: Mucous membranes are moist.      Neck: No stridor. Hematological/Lymphatic/Immunilogical: No  cervical lymphadenopathy. Cardiovascular: Normal rate, regular rhythm.  No murmurs, rubs, or gallops. Respiratory: Normal respiratory effort without tachypnea nor retractions. Breath sounds are clear and equal bilaterally. No wheezes/rales/rhonchi. Gastrointestinal: Soft and non tender. No rebound. No guarding.  Genitourinary: Deferred Musculoskeletal: Normal range of motion in all extremities. No lower extremity edema. Neurologic:  Normal speech and language. No gross focal neurologic deficits are appreciated.  Skin:  Skin is warm, dry and intact. No rash noted. Psychiatric: Mood and affect are normal. Speech and behavior are normal. Patient exhibits appropriate insight and judgment.  ____________________________________________    LABS (pertinent positives/negatives)  Trop hs 5 BMP wnl except glu 143 CBC wbc 19.3, hgb 12.7, plt 323 UA clear, wbc 0-5, rare bacteria ____________________________________________   EKG  I, Phineas Semen, attending physician, personally viewed and interpreted this EKG  EKG Time: 0837 Rate: 99 Rhythm: normal sinus rhythm Axis: normal Intervals: qtc 451 QRS: narrow ST changes: no st elevation, t wave inversion  III, V1 Impression: abnormal ekg  ____________________________________________    RADIOLOGY  CXR No acute abnormality  ____________________________________________   PROCEDURES  Procedures  ____________________________________________   INITIAL IMPRESSION / ASSESSMENT AND PLAN / ED COURSE  Pertinent labs & imaging results that were available during my care of the patient were reviewed by me and considered in my medical decision making (see chart for details).   Patient presented to the emergency department today because of concerns for chest pressure.  Patient was seen in the emergency department yesterday for rash and was given Solu-Medrol.  Patient's work-up here without concerning blood work or EKG or chest x-ray  findings.  Did have concern for possible acid reflux patient was given viscous lidocaine.  She did have relief of her symptoms after that.  This point I do wonder if patient is having a side effect of the medication.  Will give patient prescription for viscous lidocaine.  She states she already has an antacid at home.  ____________________________________________   FINAL CLINICAL IMPRESSION(S) / ED DIAGNOSES  Final diagnoses:  Chest pain, unspecified type  Gastritis, presence of bleeding unspecified, unspecified chronicity, unspecified gastritis type     Note: This dictation was prepared with Dragon dictation. Any transcriptional errors that result from this process are unintentional     Phineas SemenGoodman, Trany Chernick, MD 06/19/19 1229

## 2019-06-19 NOTE — ED Triage Notes (Signed)
Was seen last pm with rash  States woke up with chest pain this am  And feeling anxiuos

## 2019-06-19 NOTE — ED Notes (Signed)
NAD noted at time of D/C. Pt denies questions or concerns. Pt ambulatory to the lobby at this time. E-sig pad not working at this time, verbal consent for D/C obtained. Pt ambulatory to the lobby with even steady gait.

## 2019-11-03 ENCOUNTER — Emergency Department
Admission: EM | Admit: 2019-11-03 | Discharge: 2019-11-03 | Disposition: A | Discharge status: Home / Self Care | Payer: BC Managed Care – PPO | Attending: Emergency Medicine | Admitting: Emergency Medicine

## 2019-11-03 ENCOUNTER — Other Ambulatory Visit: Payer: Self-pay

## 2019-11-03 ENCOUNTER — Emergency Department: Payer: BC Managed Care – PPO

## 2019-11-03 ENCOUNTER — Encounter: Payer: Self-pay | Admitting: Emergency Medicine

## 2019-11-03 DIAGNOSIS — R6889 Other general symptoms and signs: Secondary | ICD-10-CM | POA: Diagnosis not present

## 2019-11-03 DIAGNOSIS — Z5321 Procedure and treatment not carried out due to patient leaving prior to being seen by health care provider: Secondary | ICD-10-CM | POA: Insufficient documentation

## 2019-11-03 NOTE — ED Triage Notes (Signed)
Pt to triage via w/c with no distress noted, mask in place; pt reports fell in water trench at 530pm; boot in place

## 2020-11-02 ENCOUNTER — Other Ambulatory Visit: Admission: RE | Admit: 2020-11-02 | Payer: BC Managed Care – PPO | Source: Ambulatory Visit

## 2020-11-03 ENCOUNTER — Encounter: Payer: Self-pay | Admitting: *Deleted

## 2020-11-04 ENCOUNTER — Ambulatory Visit
Admission: RE | Admit: 2020-11-04 | Discharge: 2020-11-04 | Disposition: A | Discharge status: Home / Self Care | Payer: 59 | Attending: Gastroenterology | Admitting: Gastroenterology

## 2020-11-04 ENCOUNTER — Encounter: Payer: Self-pay | Admitting: *Deleted

## 2020-11-04 ENCOUNTER — Encounter
Admission: RE | Disposition: A | Discharge status: Home / Self Care | Payer: Self-pay | Source: Home / Self Care | Attending: Gastroenterology

## 2020-11-04 ENCOUNTER — Ambulatory Visit: Payer: 59 | Admitting: Certified Registered Nurse Anesthetist

## 2020-11-04 ENCOUNTER — Other Ambulatory Visit: Payer: Self-pay

## 2020-11-04 DIAGNOSIS — Z7952 Long term (current) use of systemic steroids: Secondary | ICD-10-CM | POA: Insufficient documentation

## 2020-11-04 DIAGNOSIS — Z885 Allergy status to narcotic agent status: Secondary | ICD-10-CM | POA: Diagnosis not present

## 2020-11-04 DIAGNOSIS — Z888 Allergy status to other drugs, medicaments and biological substances status: Secondary | ICD-10-CM | POA: Diagnosis not present

## 2020-11-04 DIAGNOSIS — Z79890 Hormone replacement therapy (postmenopausal): Secondary | ICD-10-CM | POA: Insufficient documentation

## 2020-11-04 DIAGNOSIS — Z79899 Other long term (current) drug therapy: Secondary | ICD-10-CM | POA: Insufficient documentation

## 2020-11-04 DIAGNOSIS — Z886 Allergy status to analgesic agent status: Secondary | ICD-10-CM | POA: Insufficient documentation

## 2020-11-04 DIAGNOSIS — Z1211 Encounter for screening for malignant neoplasm of colon: Secondary | ICD-10-CM | POA: Diagnosis present

## 2020-11-04 HISTORY — PX: COLONOSCOPY WITH PROPOFOL: SHX5780

## 2020-11-04 HISTORY — DX: Psoriasis, unspecified: L40.9

## 2020-11-04 HISTORY — DX: Hypothyroidism, unspecified: E03.9

## 2020-11-04 HISTORY — DX: Dermatitis, unspecified: L30.9

## 2020-11-04 LAB — POCT PREGNANCY, URINE: Preg Test, Ur: NEGATIVE

## 2020-11-04 SURGERY — COLONOSCOPY WITH PROPOFOL
Anesthesia: General

## 2020-11-04 MED ORDER — PROPOFOL 10 MG/ML IV BOLUS
INTRAVENOUS | Status: DC | PRN
  Administered 2020-11-04: 100 mg via INTRAVENOUS

## 2020-11-04 MED ORDER — LIDOCAINE HCL (CARDIAC) PF 100 MG/5ML IV SOSY
PREFILLED_SYRINGE | INTRAVENOUS | Status: DC | PRN
  Administered 2020-11-04: 80 mg via INTRAVENOUS

## 2020-11-04 MED ORDER — SODIUM CHLORIDE 0.9 % IV SOLN: INTRAVENOUS | Status: DC

## 2020-11-04 MED ORDER — PROPOFOL 500 MG/50ML IV EMUL
INTRAVENOUS | Status: DC | PRN
  Administered 2020-11-04: 130 ug/kg/min via INTRAVENOUS

## 2020-11-04 NOTE — Op Note (Signed)
[facility] Gastroenterology Patient Name: Elizabeth Garner Procedure Date: 11/04/2020 12:42 PM MRN: [id number] Account #: [id number] Date of Birth: 08-29-1970 Admit Type: Outpatient Age: 50 Room: [facility] ENDO ROOM 3 Gender: Female Note Status: Finalized Procedure:             Colonoscopy Indications:           Screening for colorectal malignant neoplasm Providers:             Andrey Farmer MD, MD Medicines:             Monitored Anesthesia Care Complications:         No immediate complications. Procedure:             Pre-Anesthesia Assessment:                        - Prior to the procedure, a History and Physical was                         performed, and patient medications and allergies were                         reviewed. The patient is competent. The risks and                         benefits of the procedure and the sedation options and                         risks were discussed with the patient. All questions                         were answered and informed consent was obtained.                         Patient identification and proposed procedure were                         verified by the physician, the nurse, the anesthetist                         and the technician in the endoscopy suite. Mental                         Status Examination: alert and oriented. Airway                         Examination: normal oropharyngeal airway and neck                         mobility. Respiratory Examination: clear to                         auscultation. CV Examination: normal. Prophylactic                         Antibiotics: The patient does not require prophylactic                         antibiotics. Prior Anticoagulants: The patient has  taken no previous anticoagulant or antiplatelet                         agents. ASA Grade Assessment: II - A patient with mild                         systemic disease. After reviewing the risks and                          benefits, the patient was deemed in satisfactory                         condition to undergo the procedure. The anesthesia                         plan was to use monitored anesthesia care (MAC).                         Immediately prior to administration of medications,                         the patient was re-assessed for adequacy to receive                         sedatives. The heart rate, respiratory rate, oxygen                         saturations, blood pressure, adequacy of pulmonary                         ventilation, and response to care were monitored                         throughout the procedure. The physical status of the                         patient was re-assessed after the procedure.                        After obtaining informed consent, the colonoscope was                         passed under direct vision. Throughout the procedure,                         the patient's blood pressure, pulse, and oxygen                         saturations were monitored continuously. The                         Colonoscope was introduced through the anus and                         advanced to the the cecum, identified by appendiceal                         orifice and ileocecal valve. The colonoscopy was  performed without difficulty. The patient tolerated                         the procedure well. The quality of the bowel                         preparation was excellent. Findings:      The perianal and digital rectal examinations were normal.      The entire examined colon appeared normal on direct and retroflexion       views. Impression:            - The entire examined colon is normal on direct and                         retroflexion views.                        - No specimens collected. Recommendation:        - Discharge patient to home.                        - Resume previous diet.                        - Continue  present medications.                        - Repeat colonoscopy in 10 years for screening                         purposes.                        - Return to referring physician as previously                         scheduled. Procedure Code(s):     --- Professional ---                        Q7591, Colorectal cancer screening; colonoscopy on                         individual not meeting criteria for high risk Diagnosis Code(s):     --- Professional ---                        Z12.11, Encounter for screening for malignant neoplasm                         of colon CPT copyright 2019 [employer]. All rights reserved. The codes documented in this report are preliminary and upon coder review may  be revised to meet current compliance requirements. Andrey Farmer MD, MD 11/04/2020 1:11:32 PM Number of Addenda: 0 Note Initiated On: 11/04/2020 12:42 PM Scope Withdrawal Time: 0 hours 7 minutes 30 seconds  Total Procedure Duration: 0 hours 13 minutes 13 seconds  Estimated Blood Loss:  Estimated blood loss: none.      [facility]

## 2020-11-04 NOTE — Interval H&P Note (Signed)
History and Physical Interval Note:  11/04/2020 12:43 PM  Elizabeth Garner  has presented today for surgery, with the diagnosis of Colon Screening.  The various methods of treatment have been discussed with the patient and family. After consideration of risks, benefits and other options for treatment, the patient has consented to  Procedure(s): COLONOSCOPY WITH PROPOFOL (N/A) as a surgical intervention.  The patient's history has been reviewed, patient examined, no change in status, stable for surgery.  I have reviewed the patient's chart and labs.  Questions were answered to the patient's satisfaction.     Regis Bill  Ok to proceed with colonoscopy

## 2020-11-04 NOTE — Anesthesia Postprocedure Evaluation (Signed)
Anesthesia Post Note  Patient: Elizabeth Garner  Procedure(s) Performed: COLONOSCOPY WITH PROPOFOL (N/A )  Patient location during evaluation: PACU Anesthesia Type: General Level of consciousness: awake and alert, awake and oriented Pain management: pain level controlled Vital Signs Assessment: post-procedure vital signs reviewed and stable Respiratory status: spontaneous breathing, nonlabored ventilation and respiratory function stable Cardiovascular status: blood pressure returned to baseline and stable Postop Assessment: no apparent nausea or vomiting Anesthetic complications: no   No complications documented.   Last Vitals:  Vitals:   11/04/20 1322 11/04/20 1332  BP: 96/63 99/70  Pulse: 68   Resp: 17   Temp:    SpO2:      Last Pain:  Vitals:   11/04/20 1332  TempSrc:   PainSc: 0-No pain                 Manfred Arch

## 2020-11-04 NOTE — H&P (Signed)
Outpatient short stay form Pre-procedure 11/04/2020 12:40 PM Merlyn Lot MD, MPH  Primary Physician: None listed  Reason for visit:  Screening colonoscopy  History of present illness:   50 y/o lady with history of BE's here for screening colonoscopy. No family history of GI malignancies. No blood thinners. History of appendectomy and c-section.    Current Facility-Administered Medications:  .  0.9 %  sodium chloride infusion, , Intravenous, Continuous, Trini Soldo, Rossie Muskrat, MD, Last Rate: 20 mL/hr at 11/04/20 1229, New Bag at 11/04/20 1229  Medications Prior to Admission  Medication Sig Dispense Refill Last Dose  . acetaminophen (TYLENOL) 500 MG tablet Take 500 mg by mouth every 6 (six) hours as needed (for cramps).    Past Week at Unknown time  . coconut oil OIL See admin instructions. One tablespoonful mixed with a beverage two times a day   Past Week at Unknown time  . Flaxseed, Linseed, (FLAX SEED OIL PO) Take 1 capsule by mouth daily.    Past Week at Unknown time  . fluocinonide gel (LIDEX) 0.05 % Apply 1 application topically daily as needed. FOR ECZEMA   11/03/2020 at Unknown time  . ibuprofen (ADVIL,MOTRIN) 200 MG tablet Take 400-800 mg by mouth every 6 (six) hours as needed (for monthly cramps).    Past Week at Unknown time  . levothyroxine (SYNTHROID, LEVOTHROID) 75 MCG tablet Take 75 mcg by mouth daily.   11/04/2020 at 300  . pantoprazole (PROTONIX) 40 MG tablet Take 40 mg by mouth daily before breakfast.   11/04/2020 at 0300  . tetrahydrozoline 0.05 % ophthalmic solution Place 1-2 drops into both eyes 2 (two) times daily as needed (for irritation).    Past Week at Unknown time  . famotidine (PEPCID) 20 MG tablet Take 1 tablet (20 mg total) by mouth 2 (two) times daily. 8 tablet 0   . lidocaine (XYLOCAINE) 2 % solution Use as directed 15 mLs in the mouth or throat as needed for mouth pain. 100 mL 0   . ondansetron (ZOFRAN) 4 MG tablet Take 1 tablet (4 mg total) by mouth every 8  (eight) hours as needed for nausea or vomiting. 20 tablet 0   . phentermine (ADIPEX-P) 37.5 MG tablet phentermine 37.5 mg tablet  TAKE 1 TABLET BY MOUTH EVERY DAY IN THE MORNING     . predniSONE (DELTASONE) 20 MG tablet 3 tablets daily x 4 days 12 tablet 0      Allergies  Allergen Reactions  . Oxycodone Shortness Of Breath, Nausea And Vomiting and Other (See Comments)    Makes patient very lethargic also  . Codeine Nausea And Vomiting and Other (See Comments)    Patient states it makes her sick, violently nauseous, dizzy   . Ibuprofen Other (See Comments)    Stomach irritation  . Other Nausea And Vomiting    Pain meds (in general): Makes the patient very lethargic (also)  . Tape Rash    Patient already has eczema     Past Medical History:  Diagnosis Date  . Barrett's esophagus   . Eczema   . GERD (gastroesophageal reflux disease)   . Graves disease    In "remission"  . Graves disease   . Hypothyroidism   . PONV (postoperative nausea and vomiting)   . Psoriasis   . Sleep apnea     Review of systems:  Otherwise negative.    Physical Exam  Gen: Alert, oriented. Appears stated age.  HEENT: PERRLA. Lungs: No respiratory distress CV:  RRR Abd: soft, benign, no masses Ext: No edema    Planned procedures: Proceed with colonoscopy. The patient understands the nature of the planned procedure, indications, risks, alternatives and potential complications including but not limited to bleeding, infection, perforation, damage to internal organs and possible oversedation/side effects from anesthesia. The patient agrees and gives consent to proceed.  Please refer to procedure notes for findings, recommendations and patient disposition/instructions.     Merlyn Lot MD, MPH Gastroenterology 11/04/2020  12:40 PM

## 2020-11-04 NOTE — Anesthesia Preprocedure Evaluation (Signed)
Anesthesia Evaluation  Patient identified by MRN, date of birth, ID band Patient awake    Reviewed: Allergy & Precautions, NPO status , Patient's Chart, lab work & pertinent test results, reviewed documented beta blocker date and time   History of Anesthesia Complications (+) PONV and history of anesthetic complications  Airway Mallampati: III  TM Distance: >3 FB     Dental  (+) Chipped   Pulmonary sleep apnea ,    Pulmonary exam normal        Cardiovascular Normal cardiovascular exam     Neuro/Psych    GI/Hepatic GERD  Medicated,  Endo/Other  Hypothyroidism   Renal/GU      Musculoskeletal   Abdominal   Peds  Hematology   Anesthesia Other Findings   Reproductive/Obstetrics                             Anesthesia Physical  Anesthesia Plan  ASA: III  Anesthesia Plan: General   Post-op Pain Management:    Induction: Intravenous  PONV Risk Score and Plan: 4 or greater and Propofol infusion and TIVA  Airway Management Planned: Natural Airway and Nasal Cannula  Additional Equipment:   Intra-op Plan:   Post-operative Plan:   Informed Consent: I have reviewed the patients History and Physical, chart, labs and discussed the procedure including the risks, benefits and alternatives for the proposed anesthesia with the patient or authorized representative who has indicated his/her understanding and acceptance.       Plan Discussed with: CRNA, Anesthesiologist and Surgeon  Anesthesia Plan Comments:         Anesthesia Quick Evaluation

## 2020-11-04 NOTE — Transfer of Care (Signed)
Immediate Anesthesia Transfer of Care Note  Patient: Elizabeth Garner  Procedure(s) Performed: COLONOSCOPY WITH PROPOFOL (N/A )  Patient Location: PACU and Endoscopy Unit  Anesthesia Type:General  Level of Consciousness: awake, drowsy and patient cooperative  Airway & Oxygen Therapy: Patient Spontanous Breathing  Post-op Assessment: Report given to RN and Post -op Vital signs reviewed and stable  Post vital signs: Reviewed and stable  Last Vitals:  Vitals Value Taken Time  BP 105/72 11/04/20 1313  Temp    Pulse 83 11/04/20 1314  Resp 19 11/04/20 1314  SpO2 100 % 11/04/20 1314  Vitals shown include unvalidated device data.  Last Pain:  Vitals:   11/04/20 1207  TempSrc: Temporal  PainSc: 0-No pain         Complications: No complications documented.

## 2020-11-07 ENCOUNTER — Encounter: Payer: Self-pay | Admitting: Gastroenterology

## 2021-09-27 ENCOUNTER — Other Ambulatory Visit: Payer: Self-pay | Admitting: Obstetrics and Gynecology

## 2021-09-27 ENCOUNTER — Ambulatory Visit
Admission: RE | Admit: 2021-09-27 | Discharge: 2021-09-27 | Disposition: A | Discharge status: Home / Self Care | Payer: 59 | Source: Ambulatory Visit | Attending: Obstetrics and Gynecology | Admitting: Obstetrics and Gynecology

## 2021-09-27 DIAGNOSIS — Z1231 Encounter for screening mammogram for malignant neoplasm of breast: Secondary | ICD-10-CM | POA: Diagnosis present

## 2022-09-17 ENCOUNTER — Encounter: Payer: Self-pay | Admitting: Obstetrics and Gynecology

## 2022-10-08 ENCOUNTER — Other Ambulatory Visit: Payer: Self-pay | Admitting: Obstetrics and Gynecology

## 2022-10-08 ENCOUNTER — Ambulatory Visit
Admission: RE | Admit: 2022-10-08 | Discharge: 2022-10-08 | Disposition: A | Discharge status: Home / Self Care | Payer: 59 | Source: Ambulatory Visit | Attending: Obstetrics and Gynecology | Admitting: Obstetrics and Gynecology

## 2022-10-08 DIAGNOSIS — Z1231 Encounter for screening mammogram for malignant neoplasm of breast: Secondary | ICD-10-CM | POA: Diagnosis present

## 2023-10-09 ENCOUNTER — Other Ambulatory Visit: Payer: Self-pay | Admitting: Obstetrics and Gynecology

## 2023-10-09 DIAGNOSIS — Z1231 Encounter for screening mammogram for malignant neoplasm of breast: Secondary | ICD-10-CM

## 2023-10-10 ENCOUNTER — Ambulatory Visit
Admission: RE | Admit: 2023-10-10 | Discharge: 2023-10-10 | Disposition: A | Discharge status: Home / Self Care | Source: Ambulatory Visit | Attending: Obstetrics and Gynecology | Admitting: Obstetrics and Gynecology

## 2023-10-10 DIAGNOSIS — Z1231 Encounter for screening mammogram for malignant neoplasm of breast: Secondary | ICD-10-CM | POA: Diagnosis present

## 2023-10-18 IMAGING — MG MM DIGITAL SCREENING BILAT W/ TOMO AND CAD
8 series · 8 of 24 positions shown · non-contrast
Comparison: Previous exam(s).

CLINICAL DATA: Screening.

EXAM:
DIGITAL SCREENING BILATERAL MAMMOGRAM WITH TOMOSYNTHESIS AND CAD
TECHNIQUE: Bilateral screening digital craniocaudal and mediolateral oblique
mammograms were obtained. Bilateral screening digital breast
tomosynthesis was performed. The images were evaluated with
computer-aided detection.

[L CC synth-2D]
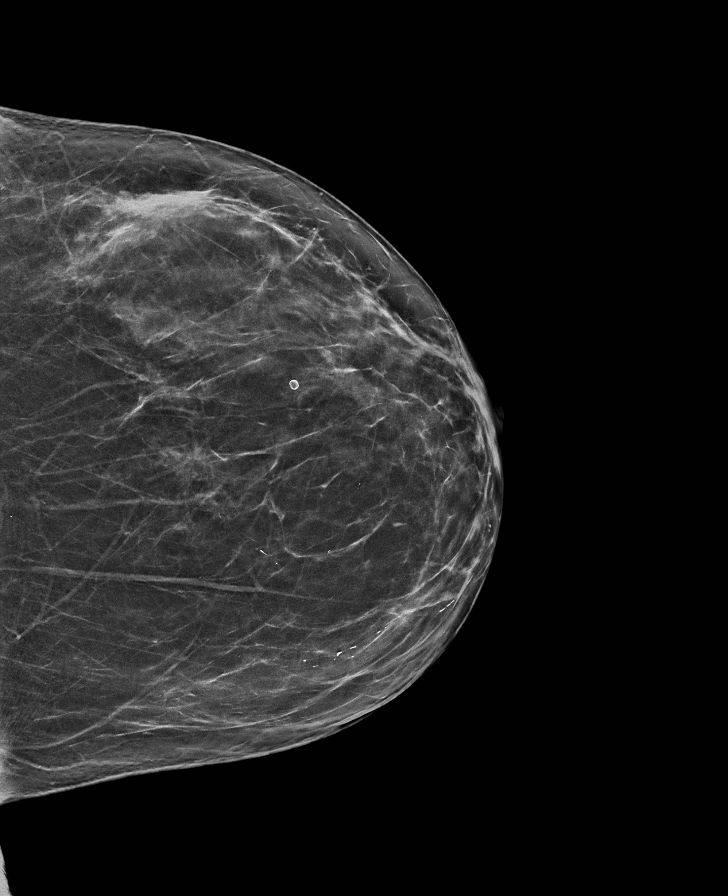

[R MLO synth-2D]
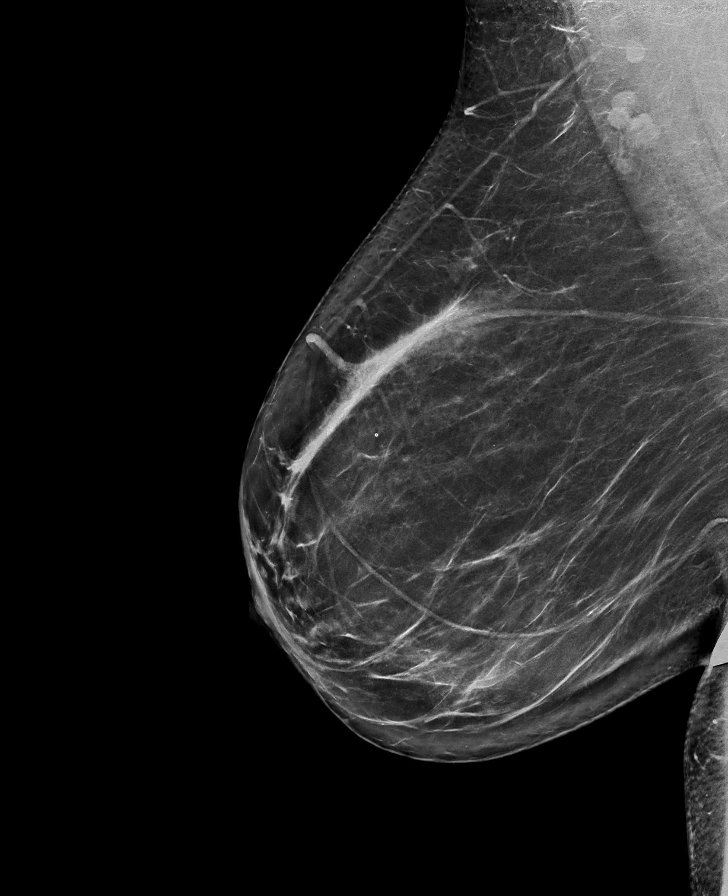

[L MLO synth-2D]
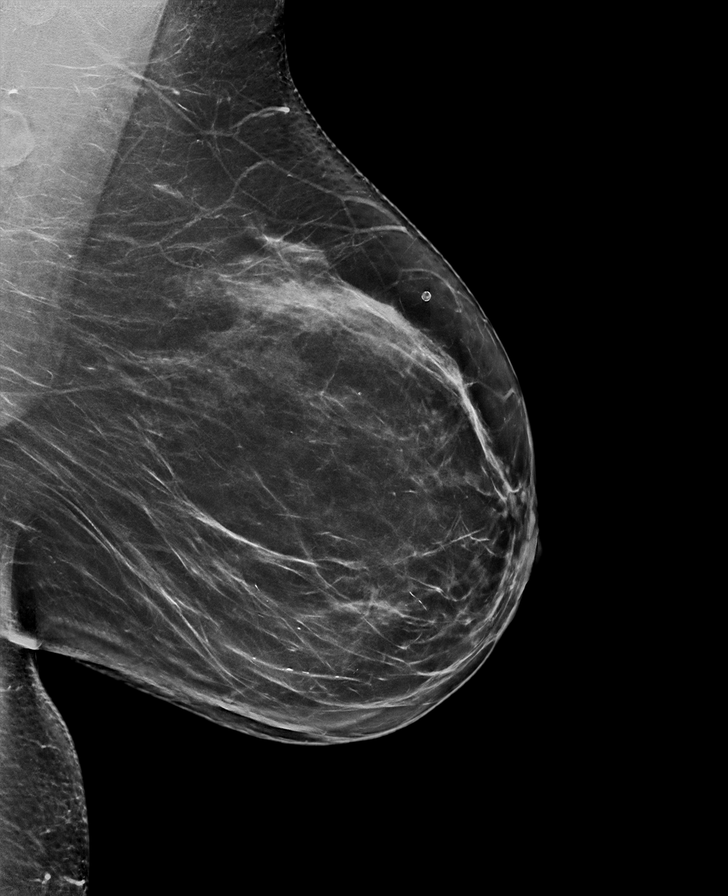

[R CC synth-2D]
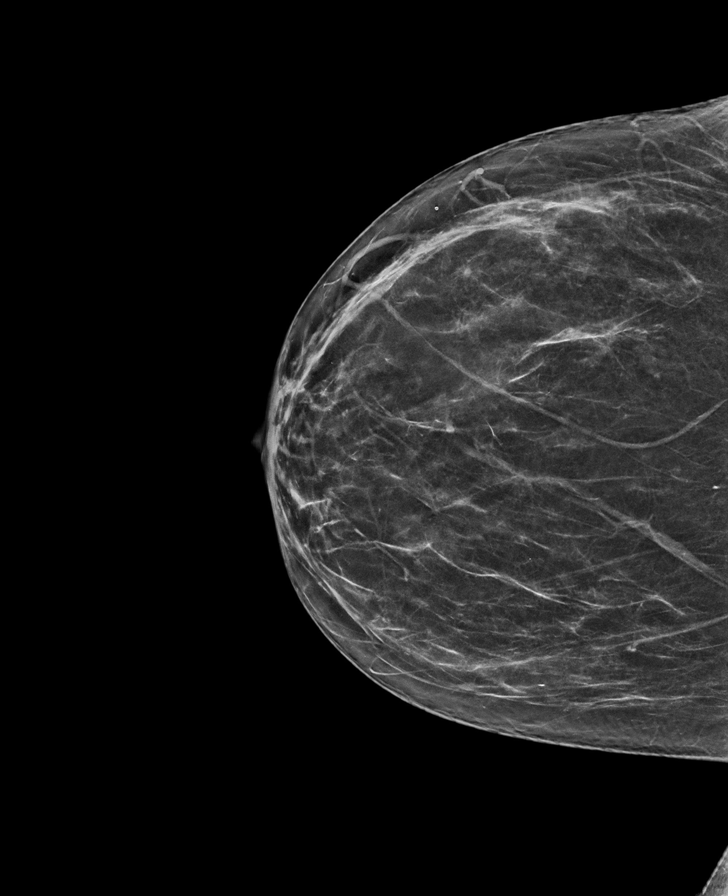

[L MLO tomo · tomo slice 43/85.0]
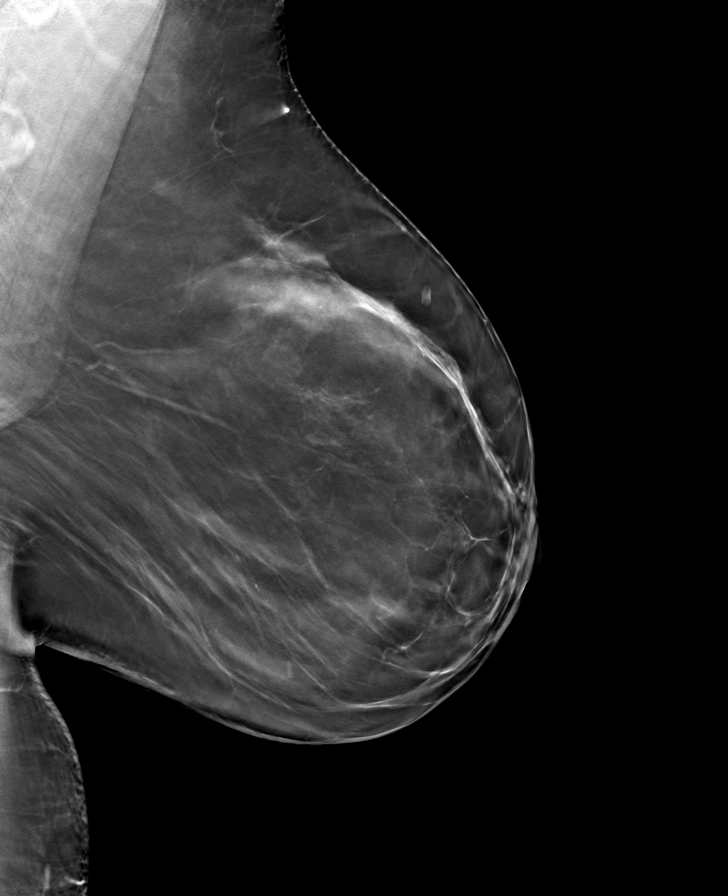

[R MLO tomo · tomo slice 41/81.0]
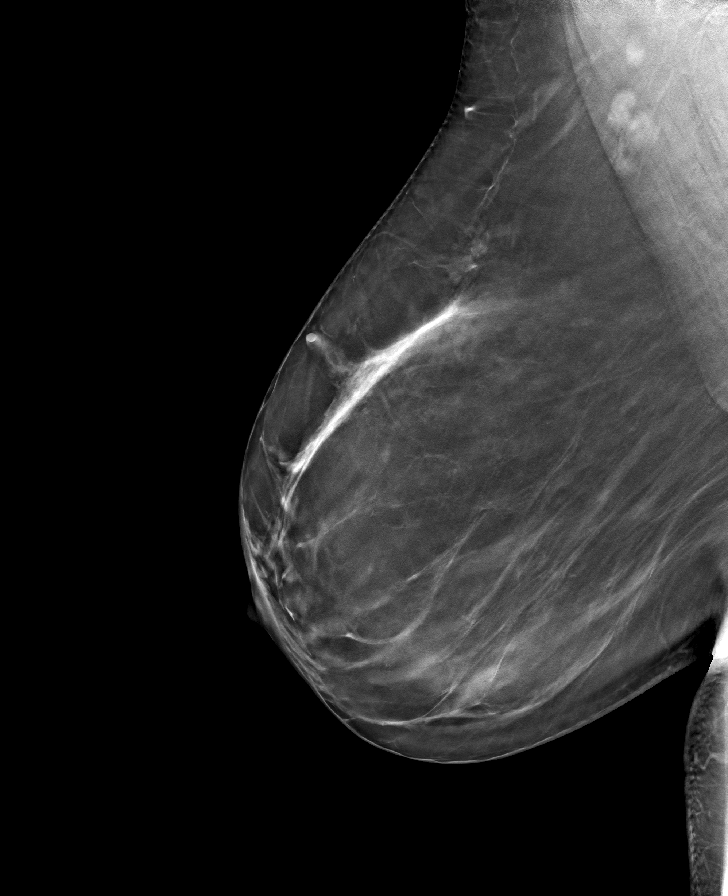

[L CC tomo · tomo slice 38/75.0]
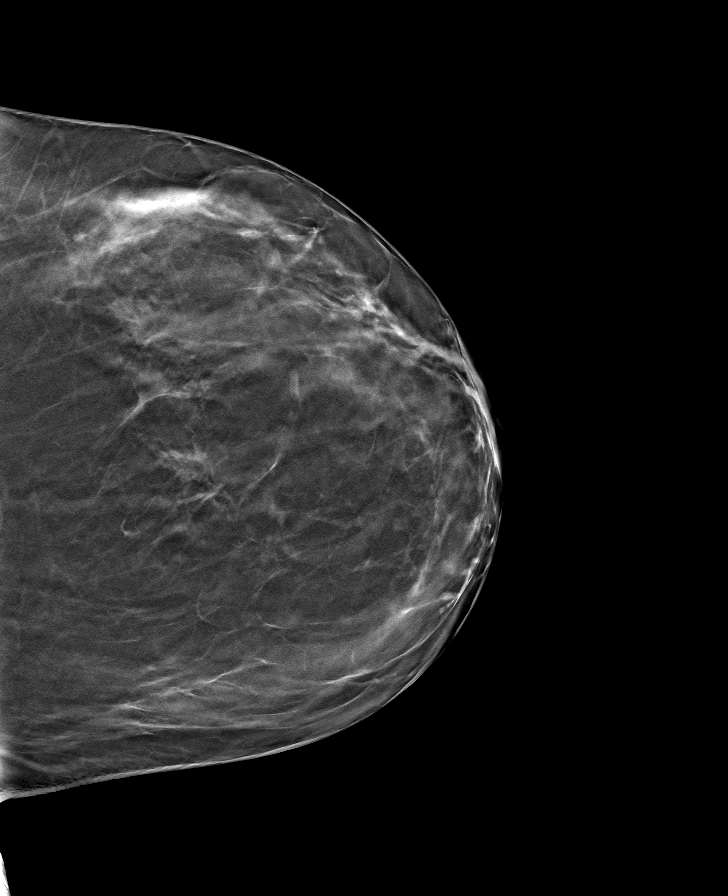

[R CC tomo · tomo slice 33/65.0]
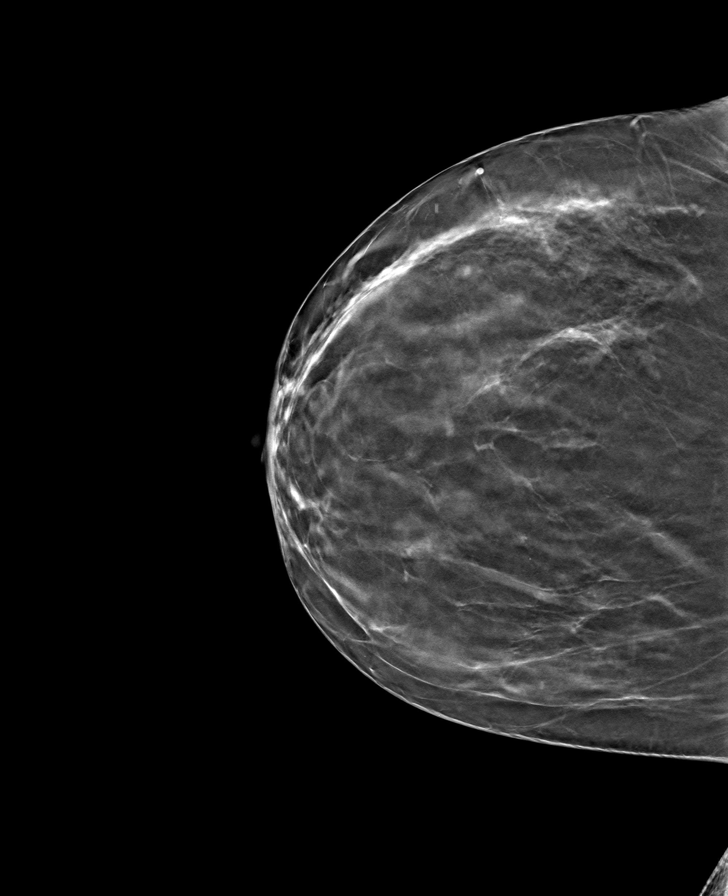

[8 of 24 positions shown; findings below may reference images not displayed]

ACR Breast Density Category b: There are scattered areas of
fibroglandular density.
FINDINGS: There are no findings suspicious for malignancy.
IMPRESSION: No mammographic evidence of malignancy. A result letter of this
screening mammogram will be mailed directly to the patient.

RECOMMENDATION:
Screening mammogram in one year. (Code:51-O-LD2)

BI-RADS CATEGORY  1: Negative.

## 2023-12-30 ENCOUNTER — Other Ambulatory Visit: Payer: Self-pay

## 2023-12-30 ENCOUNTER — Encounter: Payer: Self-pay | Admitting: Emergency Medicine

## 2023-12-30 ENCOUNTER — Emergency Department
Admission: EM | Admit: 2023-12-30 | Discharge: 2023-12-30 | Disposition: A | Discharge status: Home / Self Care | Attending: Emergency Medicine | Admitting: Emergency Medicine

## 2023-12-30 ENCOUNTER — Emergency Department

## 2023-12-30 DIAGNOSIS — M25562 Pain in left knee: Secondary | ICD-10-CM | POA: Insufficient documentation

## 2023-12-30 NOTE — ED Provider Notes (Signed)
 [facility] Provider Note    Event Date/Time   First MD Initiated Contact with Patient 12/30/23 1737     (approximate)   History   Knee Pain   HPI  Elizabeth Garner is a 53 y.o. female who presents today for evaluation of left knee pain.  Patient reports that she has had pain for approximately 3 months.  She was stepping off a ladder and when she went down to the bottom rung she felt a sharp pain to her knee.  There was no trauma or falls or abnormal appearance to her knee.  She denies numbness or tingling.  She is able to ambulate.  Patient Active Problem List   Diagnosis Date Noted   Abnormal uterine bleeding 01/04/2012          Physical Exam   Triage Vital Signs: ED Triage Vitals  Encounter Vitals Group     BP 12/30/23 1530 (!) 142/92     Girls Systolic BP Percentile --      Girls Diastolic BP Percentile --      Boys Systolic BP Percentile --      Boys Diastolic BP Percentile --      Pulse Rate 12/30/23 1530 87     Resp 12/30/23 1530 17     Temp 12/30/23 1530 98.2 F (36.8 C)     Temp Source 12/30/23 1530 Oral     SpO2 12/30/23 1530 100 %     Weight 12/30/23 1527 187 lb 6.3 oz (85 kg)     Height 12/30/23 1527 5' (1.524 m)     Head Circumference --      Peak Flow --      Pain Score 12/30/23 1527 10     Pain Loc --      Pain Education --      Exclude from Growth Chart --     Most recent vital signs: Vitals:   12/30/23 1530  BP: (!) 142/92  Pulse: 87  Resp: 17  Temp: 98.2 F (36.8 C)  SpO2: 100%    Physical Exam Vitals and nursing note reviewed.  Constitutional:      General: Awake and alert. No acute distress.    Appearance: Normal appearance. The patient is normal weight.  HENT:     Head: Normocephalic and atraumatic.     Mouth: Mucous membranes are moist.  Eyes:     General: PERRL. Normal EOMs        Right eye: No discharge.        Left eye: No discharge.     Conjunctiva/sclera: Conjunctivae normal.   Cardiovascular:     Rate and Rhythm: Normal rate and regular rhythm.     Pulses: Normal pulses.  Pulmonary:     Effort: Pulmonary effort is normal. No respiratory distress.     Breath sounds: Normal breath sounds.  Abdominal:     Abdomen is soft. There is no abdominal tenderness. No rebound or guarding. No distention. Musculoskeletal:        General: No swelling. Normal range of motion.     Cervical back: Normal range of motion and neck supple. Left knee: no deformity or rash. No joint line tenderness. No patellar tenderness, no ballotment Warm and well perfused extremity with 2+ pedal pulses 5/5 strength to dorsiflexion and plantarflexion at the ankle with intact sensation throughout extremity Normal range of motion of the knee, with intact flexion and extension to active and passive range of motion. Extensor mechanism  intact. No ligamentous laxity. Negative anterior/posterior drawer/negative lachman, negative mcmurrays No effusion or warmth Intact quadriceps, hamstring function, patellar tendon function Mild tenderness to proximal gastrocnemius head. Pelvis stable Full ROM of ankle without pain or swelling Foot warm and well perfused, normal pedal pulses and equal to opposite, normal capillary refill to all toes.  Normal flexion and extension at the ankle, no mild pain with flexion against resistance.  No pitting edema.  Negative Thompson test. Skin:    General: Skin is warm and dry.     Capillary Refill: Capillary refill takes less than 2 seconds.     Findings: No rash.  Neurological:     Mental Status: The patient is awake and alert.      ED Results / Procedures / Treatments   Labs (all labs ordered are listed, but only abnormal results are displayed) Labs Reviewed - No data to display   EKG     RADIOLOGY I independently reviewed and interpreted imaging and agree with radiologists findings.     PROCEDURES:  Critical Care performed:    Procedures   MEDICATIONS ORDERED IN ED: Medications - No data to display   IMPRESSION / MDM / ASSESSMENT AND PLAN / ED COURSE  I reviewed the triage vital signs and the nursing notes.   Differential diagnosis includes, but is not limited to, meniscus injury, ligamental injury, muscle strain.  Patient is awake and alert, hemodynamically stable and afebrile.  She has full and normal range of motion of her knee, able to flex and extend against resistance, no ligamental laxity or instability noted.  She has normal and intact sensation to bilateral lower extremities that are equal, and she is able and normal pedal pulses bilaterally.  Her feet are warm and well-perfused bilaterally, equal in temperature bilaterally.  Do not suspect popliteal vessel injury  She has tenderness to palpation at the proximal gastroc, though no deformity or ecchymosis noted.  I do not suspect complete muscle tear, however she might have strained this area.  She has pain with activation of the gastrocnemius muscle.  Negative Thompson test, do not suspect Achilles tendon rupture.  Full normal range of motion at her ankle, knee, hip.  There is no pitting edema to suggest DVT.  X-ray obtained in triage is negative for any acute findings.  No effusion to suggest ligamental rupture.  The areas were wrapped with an Ace bandage.  We discussed very strict return precautions for signs of paresthesias, weakness, or any other changes to suggest more ominous pathology.  Patient understands and agrees with plan.  Discharged stable condition.  She requested a work note which was provided.   Patient's presentation is most consistent with acute complicated illness / injury requiring diagnostic workup.      FINAL CLINICAL IMPRESSION(S) / ED DIAGNOSES   Final diagnoses:  Acute pain of left knee     Rx / DC Orders   ED Discharge Orders     None        Note:  This document was prepared using Dragon voice recognition  software and may include unintentional dictation errors.   Svara Twyman E, PA-C 12/30/23 1800    Goodman, Graydon, MD 01/01/24 [phone]

## 2023-12-30 NOTE — Discharge Instructions (Signed)
 It is possible that you have a small tear to one of your calf muscles.  Please follow-up with your outpatient provider.  You can use the Ace wrap during the day while you are walking and remove it at night.  Please return if you develop worsening pain, numbness, swelling, or any other concerns.  It was a pleasure caring for you today.

## 2023-12-30 NOTE — ED Triage Notes (Signed)
 Patient to ED via POV for left knee pain. States tightness x3 months. Pt reports she came off a ladder today and heard a pop on the back of her knee. Painful to move.

## 2024-03-11 ENCOUNTER — Ambulatory Visit
Admission: EM | Admit: 2024-03-11 | Discharge: 2024-03-11 | Disposition: A | Discharge status: Home / Self Care | Attending: Family Medicine | Admitting: Family Medicine

## 2024-03-11 ENCOUNTER — Encounter: Payer: Self-pay | Admitting: Emergency Medicine

## 2024-03-11 DIAGNOSIS — R21 Rash and other nonspecific skin eruption: Secondary | ICD-10-CM

## 2024-03-11 DIAGNOSIS — W57XXXA Bitten or stung by nonvenomous insect and other nonvenomous arthropods, initial encounter: Secondary | ICD-10-CM

## 2024-03-11 DIAGNOSIS — S20162A Insect bite (nonvenomous) of breast, left breast, initial encounter: Secondary | ICD-10-CM | POA: Diagnosis not present

## 2024-03-11 MED ORDER — FLUCONAZOLE 150 MG PO TABS: 150.0000 mg | ORAL_TABLET | Freq: Once | ORAL | 0 refills | Status: AC

## 2024-03-11 MED ORDER — DOXYCYCLINE HYCLATE 100 MG PO CAPS: 100.0000 mg | ORAL_CAPSULE | Freq: Two times a day (BID) | ORAL | 0 refills | Status: AC

## 2024-03-11 NOTE — ED Triage Notes (Signed)
 Pt presents with an insect bite underneath her left breast x 3 days.

## 2024-03-11 NOTE — ED Provider Notes (Signed)
 [facility]    CSN: [id number] Arrival date & time: 03/11/24  0831      History   Chief Complaint Chief Complaint  Patient presents with   Insect Bite    HPI CANDY LEVERETT is a 53 y.o. female.   HPI  Rosaline presents for her left breast.  She was house in the building and picking up boxes and thinks a spider bit her.    There is been no new products including soaps and detergents.  No eye irritation, sore throat, difficulty breathing, nausea, vomiting or diarrhea.  Denies belly pain, joint pain and fever.  There has been no medication changes or new supplements.  Denies any new foods or drinks.    ***Redness, pain, swelling ***Treatments tried ***Previous symptoms ***Insect or bug bites  Fever : no Vision changes: No Sore throat: no   Shortness of breath: no Rhinorrhea: no Appetite: normal  Hydration: normal  Abdominal pain: no Nausea: no Vomiting: no Diarrhea: no Dysuria: no  Sleep disturbance: no Arthralgias: no Headache: no   Past Medical History:  Diagnosis Date   Barrett's esophagus    Eczema    GERD (gastroesophageal reflux disease)    Graves disease    In remission   Graves disease    Hypothyroidism    PONV (postoperative nausea and vomiting)    Psoriasis    Sleep apnea     Patient Active Problem List   Diagnosis Date Noted   Abnormal uterine bleeding 01/04/2012    Past Surgical History:  Procedure Laterality Date   APPENDECTOMY     CESAREAN SECTION     CESAREAN SECTION     CLOSED REDUCTION FINGER WITH PERCUTANEOUS PINNING Right 09/25/2016   Procedure: CLOSED PINNING OF RIGHT INDEX AND LONG FINGERS PHALANGES FRACTURES;  Surgeon: Alm Hummer, MD;  Location: [facility];  Service: Orthopedics;  Laterality: Right;   COLONOSCOPY     COLONOSCOPY WITH PROPOFOL  N/A 11/04/2020   Procedure: COLONOSCOPY WITH PROPOFOL ;  Surgeon: Maryruth Ole DASEN, MD;  Location: [facility];  Service: Endoscopy;  Laterality: N/A;    ESOPHAGOGASTRODUODENOSCOPY     ESOPHAGOGASTRODUODENOSCOPY (EGD) WITH PROPOFOL  N/A 05/06/2015   Procedure: ESOPHAGOGASTRODUODENOSCOPY (EGD) WITH PROPOFOL ;  Surgeon: Gladis RAYMOND Mariner, MD;  Location: [facility] ENDOSCOPY;  Service: Endoscopy;  Laterality: N/A;   ESOPHAGOGASTRODUODENOSCOPY (EGD) WITH PROPOFOL  N/A 02/08/2017   Procedure: ESOPHAGOGASTRODUODENOSCOPY (EGD) WITH PROPOFOL ;  Surgeon: Mariner Gladis RAYMOND, MD;  Location: [facility] ENDOSCOPY;  Service: Endoscopy;  Laterality: N/A;   repair and excision eyl\elid     SUTURE REMOVAL Right 09/25/2016   Procedure: SUTURE REMOVAL AND CLOSURE RIGHT HAND LACERATION;  Surgeon: Alm Hummer, MD;  Location: [facility];  Service: Orthopedics;  Laterality: Right;    OB History   No obstetric history on file.      Home Medications    Prior to Admission medications   Medication Sig Start Date End Date Taking? Authorizing Provider  albuterol (VENTOLIN HFA) 108 (90 Base) MCG/ACT inhaler Inhale 2 puffs into the lungs. 08/09/22  Yes [provider]  estradiol (ESTRACE) 0.1 MG/GM vaginal cream 1/4 applicator per vagina 1-2 times weekly 05/13/23  Yes [provider]  paragard intrauterine copper IUD IUD Take by intrauterine route. 05/02/07  Yes [provider]  acetaminophen  (TYLENOL ) 500 MG tablet Take 500 mg by mouth every 6 (six) hours as needed (for cramps).     [provider]  cetirizine (ZYRTEC) 10 MG tablet Take 1 tablet by mouth at  bedtime.    [provider]  coconut oil OIL See admin instructions. One tablespoonful mixed with a beverage two times a day    [provider]  dexlansoprazole (DEXILANT) 60 MG capsule     [provider]  EPINEPHrine  0.3 mg/0.3 mL IJ SOAJ injection     [provider]  famotidine  (PEPCID ) 20 MG tablet Take 1 tablet (20 mg total) by mouth 2 (two) times daily. 06/18/19   Sung, Jade J, MD  Flaxseed, Linseed, (FLAX SEED OIL PO) Take 1 capsule by  mouth daily.     [provider]  fluocinonide gel (LIDEX) 0.05 % Apply 1 application topically daily as needed. FOR ECZEMA    [provider]  ibuprofen (ADVIL,MOTRIN) 200 MG tablet Take 400-800 mg by mouth every 6 (six) hours as needed (for monthly cramps).     [provider]  letrozole (FEMARA) 2.5 MG tablet TAKE 1 TABLET(S) EVERY DAY BY ORAL ROUTE FOR 5 DAYS. PT TO START TAKING 01-08-2015    [provider]  levothyroxine (SYNTHROID, LEVOTHROID) 75 MCG tablet Take 75 mcg by mouth daily. 09/14/16   [provider]  lidocaine  (XYLOCAINE ) 2 % solution Use as directed 15 mLs in the mouth or throat as needed for mouth pain. 06/19/19   Goodman, Graydon, MD  ondansetron  (ZOFRAN ) 4 MG tablet Take 1 tablet (4 mg total) by mouth every 8 (eight) hours as needed for nausea or vomiting. 09/25/16   Sebastian Lenis, MD  pantoprazole (PROTONIX) 40 MG tablet Take 40 mg by mouth daily before breakfast. 08/23/14   [provider]  phentermine (ADIPEX-P) 37.5 MG tablet phentermine 37.5 mg tablet  TAKE 1 TABLET BY MOUTH EVERY DAY IN THE MORNING 02/12/18   [provider]  predniSONE  (DELTASONE ) 20 MG tablet 3 tablets daily x 4 days 06/18/19   Sung, Jade J, MD  tetrahydrozoline 0.05 % ophthalmic solution Place 1-2 drops into both eyes 2 (two) times daily as needed (for irritation).     [provider]    Family History Family History  Problem Relation Age of Onset   Cancer Mother    Breast cancer Neg Hx     Social History Social History   Tobacco Use   Smoking status: Never   Smokeless tobacco: Never  Vaping Use   Vaping status: Never Used  Substance Use Topics   Alcohol use: Not Currently   Drug use: No     Allergies   Oxycodone, Codeine, Ibuprofen, Influenza vaccines, Other, Tape, and Tapentadol   Review of Systems Review of Systems :negative unless otherwise stated in HPI.      Physical Exam Triage Vital Signs ED Triage  Vitals  Encounter Vitals Group     BP 03/11/24 0842 (!) 148/86     Girls Systolic BP Percentile --      Girls Diastolic BP Percentile --      Boys Systolic BP Percentile --      Boys Diastolic BP Percentile --      Pulse Rate 03/11/24 0842 90     Resp 03/11/24 0842 16     Temp 03/11/24 0842 98 F (36.7 C)     Temp Source 03/11/24 0842 Oral     SpO2 03/11/24 0842 99 %     Weight 03/11/24 0840 189 lb (85.7 kg)     Height --      Head Circumference --      Peak Flow --  Pain Score 03/11/24 0839 0     Pain Loc --      Pain Education --      Exclude from Growth Chart --    No data found.  Updated Vital Signs BP (!) 148/86 (BP Location: Left Arm)   Pulse 90   Temp 98 F (36.7 C) (Oral)   Resp 16   Wt 85.7 kg   SpO2 99%   BMI 36.91 kg/m   Visual Acuity Right Eye Distance:   Left Eye Distance:   Bilateral Distance:    Right Eye Near:   Left Eye Near:    Bilateral Near:     Physical Exam  GEN: alert, well appearing female, in no acute distress *** EYES: no scleral injection or discharge*** CV: regular rate and rhythm *** RESP: no increased work of breathing, clear to ascultation bilaterally*** MSK: no extremity edema *** NEURO: alert, moves all extremities appropriately SKIN: warm and dry; ***   ***pic  UC Treatments / Results  Labs (all labs ordered are listed, but only abnormal results are displayed) Labs Reviewed - No data to display  EKG   Radiology No results found.  Procedures Procedures (including critical care time)  Medications Ordered in UC Medications - No data to display  Initial Impression / Assessment and Plan / UC Course  I have reviewed the triage vital signs and the nursing notes.  Pertinent labs & imaging results that were available during my care of the patient were reviewed by me and considered in my medical decision making (see chart for details).     Patient is a 53 y.o. femalewho presents for***.  Overall, patient is  well-appearing and well-hydrated.  Vital signs stable.  Isabeau is afebrile.  Exam concerning for ***.  Treat with***steroid ointment. ***No sign of infection to suggest antibiotics or antifungals at this time.  ***Not likely viral exanthem.   Contact Dermatitis Patient is a 53 y.o. female who presents for ***worsening rash for the ***.  Overall, patient is well-appearing and well-hydrated.  Vital signs stable.  Frannie P Melby is ***afebrile.  History and exam concerning for ***contact dermatitis.  ***Decadron  10 mg IM given.  Treat with ***prednisone  taper and steroid ointment.  Claritin or Zyrtec twice a day for additional itch relief. No sign of infection to suggest antifungals or antibiotics at this time.    Reviewed expectations regarding course of current medical issues.  All questions asked were answered.  Outlined signs and symptoms indicating need for more acute intervention. Patient verbalized understanding. After Visit Summary given.   Final Clinical Impressions(s) / UC Diagnoses   Final diagnoses:  None   Discharge Instructions   None    ED Prescriptions   None    PDMP not reviewed this encounter.

## 2024-03-11 NOTE — Discharge Instructions (Addendum)
 Stop by the pharmacy to pick up your prescriptions.  Follow up with your primary care provider or return to the urgent care, if not improving.
